# Patient Record
Sex: Female | Born: 1955 | Race: Black or African American | Hispanic: No | Marital: Married | Smoking: Never smoker
Health system: Southern US, Community
[De-identification: ages and names within clinical notes are randomized; demographics above are authoritative.]

## PROBLEM LIST (undated history)

## (undated) DIAGNOSIS — R0600 Dyspnea, unspecified: Secondary | ICD-10-CM

## (undated) DIAGNOSIS — K5900 Constipation, unspecified: Secondary | ICD-10-CM

## (undated) DIAGNOSIS — M1990 Unspecified osteoarthritis, unspecified site: Secondary | ICD-10-CM

## (undated) DIAGNOSIS — F329 Major depressive disorder, single episode, unspecified: Secondary | ICD-10-CM

## (undated) DIAGNOSIS — E119 Type 2 diabetes mellitus without complications: Secondary | ICD-10-CM

## (undated) DIAGNOSIS — I10 Essential (primary) hypertension: Secondary | ICD-10-CM

## (undated) DIAGNOSIS — IMO0001 Reserved for inherently not codable concepts without codable children: Secondary | ICD-10-CM

## (undated) DIAGNOSIS — R011 Cardiac murmur, unspecified: Secondary | ICD-10-CM

## (undated) DIAGNOSIS — K219 Gastro-esophageal reflux disease without esophagitis: Secondary | ICD-10-CM

## (undated) DIAGNOSIS — R310 Gross hematuria: Secondary | ICD-10-CM

## (undated) DIAGNOSIS — L309 Dermatitis, unspecified: Secondary | ICD-10-CM

## (undated) DIAGNOSIS — F32A Depression, unspecified: Secondary | ICD-10-CM

## (undated) HISTORY — DX: Major depressive disorder, single episode, unspecified: F32.9

## (undated) HISTORY — DX: Reserved for inherently not codable concepts without codable children: IMO0001

## (undated) HISTORY — DX: Gastro-esophageal reflux disease without esophagitis: K21.9

## (undated) HISTORY — DX: Dermatitis, unspecified: L30.9

## (undated) HISTORY — PX: NO PAST SURGERIES: SHX2092

## (undated) HISTORY — DX: Cardiac murmur, unspecified: R01.1

## (undated) HISTORY — DX: Gross hematuria: R31.0

## (undated) HISTORY — DX: Constipation, unspecified: K59.00

## (undated) HISTORY — DX: Depression, unspecified: F32.A

## (undated) HISTORY — DX: Essential (primary) hypertension: I10

## (undated) HISTORY — DX: Dyspnea, unspecified: R06.00

## (undated) HISTORY — DX: Unspecified osteoarthritis, unspecified site: M19.90

## (undated) HISTORY — DX: Type 2 diabetes mellitus without complications: E11.9

---

## 2004-06-16 ENCOUNTER — Ambulatory Visit (HOSPITAL_COMMUNITY): Admission: RE | Admit: 2004-06-16 | Discharge: 2004-06-16 | Payer: Self-pay | Admitting: General Surgery

## 2010-11-03 ENCOUNTER — Ambulatory Visit (INDEPENDENT_AMBULATORY_CARE_PROVIDER_SITE_OTHER): Payer: Managed Care, Other (non HMO) | Admitting: Internal Medicine

## 2010-11-03 DIAGNOSIS — D649 Anemia, unspecified: Secondary | ICD-10-CM

## 2010-11-03 DIAGNOSIS — E109 Type 1 diabetes mellitus without complications: Secondary | ICD-10-CM

## 2010-11-25 ENCOUNTER — Other Ambulatory Visit (INDEPENDENT_AMBULATORY_CARE_PROVIDER_SITE_OTHER): Payer: Self-pay | Admitting: Internal Medicine

## 2010-11-25 ENCOUNTER — Ambulatory Visit (HOSPITAL_COMMUNITY)
Admission: RE | Admit: 2010-11-25 | Discharge: 2010-11-25 | Disposition: A | Discharge status: Home / Self Care | Payer: Managed Care, Other (non HMO) | Source: Ambulatory Visit | Attending: Internal Medicine | Admitting: Internal Medicine

## 2010-11-25 ENCOUNTER — Encounter (HOSPITAL_BASED_OUTPATIENT_CLINIC_OR_DEPARTMENT_OTHER): Payer: Managed Care, Other (non HMO) | Admitting: Internal Medicine

## 2010-11-25 DIAGNOSIS — I10 Essential (primary) hypertension: Secondary | ICD-10-CM | POA: Insufficient documentation

## 2010-11-25 DIAGNOSIS — R195 Other fecal abnormalities: Secondary | ICD-10-CM

## 2010-11-25 DIAGNOSIS — D6489 Other specified anemias: Secondary | ICD-10-CM | POA: Insufficient documentation

## 2010-11-25 DIAGNOSIS — D509 Iron deficiency anemia, unspecified: Secondary | ICD-10-CM

## 2010-11-25 DIAGNOSIS — Z01812 Encounter for preprocedural laboratory examination: Secondary | ICD-10-CM | POA: Insufficient documentation

## 2010-11-25 DIAGNOSIS — E119 Type 2 diabetes mellitus without complications: Secondary | ICD-10-CM | POA: Insufficient documentation

## 2010-11-25 DIAGNOSIS — K921 Melena: Secondary | ICD-10-CM | POA: Insufficient documentation

## 2010-11-25 DIAGNOSIS — K449 Diaphragmatic hernia without obstruction or gangrene: Secondary | ICD-10-CM | POA: Insufficient documentation

## 2010-11-25 DIAGNOSIS — Z79899 Other long term (current) drug therapy: Secondary | ICD-10-CM | POA: Insufficient documentation

## 2010-11-25 DIAGNOSIS — D126 Benign neoplasm of colon, unspecified: Secondary | ICD-10-CM

## 2010-11-25 DIAGNOSIS — K2960 Other gastritis without bleeding: Secondary | ICD-10-CM

## 2010-11-25 DIAGNOSIS — K5730 Diverticulosis of large intestine without perforation or abscess without bleeding: Secondary | ICD-10-CM | POA: Insufficient documentation

## 2010-11-25 DIAGNOSIS — K648 Other hemorrhoids: Secondary | ICD-10-CM | POA: Insufficient documentation

## 2010-11-25 LAB — VITAMIN B12: Vitamin B-12: 1508 pg/mL — ABNORMAL HIGH (ref 211–911)

## 2010-11-25 LAB — HEMOGLOBIN AND HEMATOCRIT, BLOOD: HCT: 34.4 % — ABNORMAL LOW (ref 36.0–46.0)

## 2010-11-25 LAB — GLUCOSE, CAPILLARY: Glucose-Capillary: 118 mg/dL — ABNORMAL HIGH (ref 70–99)

## 2010-12-20 NOTE — Op Note (Signed)
NAMEJASIE, Krista [name]               ACCOUNT NO.:  [id number]  MEDICAL RECORD NO.:  [id number]           PATIENT TYPE:  O  LOCATION:  DAYP                          FACILITY:  [facility]  PHYSICIAN:  Lionel December, M.D.    DATE OF BIRTH:  28-Sep-1955  DATE OF PROCEDURE:  11/25/2010 DATE OF DISCHARGE:                              OPERATIVE REPORT   PROCEDURE:  Esophagogastroduodenoscopy followed by colonoscopy.  INDICATION:  Krista [name] is 55 year old Afro American female who was found to have anemia.  Her MCV is low, but iron studies do not confirm iron deficiency.  Serum iron was 51, TIBC 284, and saturation is slightly decreased at 80%, but ferritin is under 18 which is within normal limits.  However, she has heme positive stools.  She has been on chronic NSAID therapy.  The patient's last colonoscopy was in 2003.  Procedures were reviewed with the patient.  Informed consent was obtained.  MEDS FOR CONSCIOUS SEDATION:  Cetacaine spray for oropharyngeal topical anesthesia, Demerol 50 mg IV, Versed 7 mg IV.  FINDINGS:  Procedure performed in endoscopy suite.  The patient's vital signs and O2 sat were monitored during the procedure and remained stable.  PROCEDURES: 1. Esophagogastroduodenoscopy.  The patient was placed in left lateral     recumbent position and Pentax videoscope was passed via oropharynx     without any difficulty into esophagus. 2. Esophagus.  Mucosa of the esophagus was normal.  GE junction was at     36 cm and hiatus was at 38. 3. Stomach.  It was empty and distended very well by insufflation.     Folds in the proximal stomach are atrophic.  Examination of mucosa     at body, antrum, pyloric channel, as well as angularis, fundus, and     cardia was normal. 4. Duodenum.  Bulbar mucosa was normal.  Scope was passed in second     part of duodenal mucosa and folds were normal.  Endoscope was     withdrawn.  The patient prepared for procedure #2. 5. Colonoscopy.  Rectal  examination performed.  No abnormality noted     on external or digital exam.  Pentax videoscope was placed through     rectum and advanced under vision into sigmoid colon and beyond.     Preparation was excellent.  Few tiny diverticula were noted at     sigmoid colon.  Scope was passed into cecum which was identified by     appendiceal orifice and ileocecal valve.  Pictures were taken for     the record.  As the scope was withdrawn, colonic mucosa was     carefully examined.  There was 3-mm polyp at ascending colon which     was ablated via cold biopsy.  Mucosa and rest of the colon was     normal.  Rectal mucosa similarly was normal.  Scope was retroflexed     to examine anorectal junction and small hemorrhoids noted below the     dentate line.  Endoscope was then withdrawn.  Withdrawal time was     11 minutes.  The patient tolerated  the procedures well.  FINAL DIAGNOSES: 1. No evidence of peptic ulcer disease. 2. A 2-cm size sliding hiatal hernia. 3. Atrophic gastric folds. 4. Colonoscopy performed to cecum. 5. A 3-mm polyp ablated via cold biopsy from the ascending colon. 6. Few tiny diverticula at sigmoid colon and external hemorrhoids.  RECOMMENDATIONS: 1. The patient advised to take Mobic once a day if possible. 2. We will check her serum B12 level and H and H today. 3. I will be contacting patient with results of biopsy and blood test     further recommendations.     Lionel December, M.D.     NR/MEDQ  D:  11/25/2010  T:  11/25/2010  Job:  [id number]  cc:   Dr. Sherril Croon  Electronically Signed by Lionel December M.D. on 12/20/2010 10:31:56 AM

## 2010-12-20 NOTE — Consult Note (Signed)
NAMETENESHA, [name]               ACCOUNT NO.:  [id number]  MEDICAL RECORD NO.:  [id number]           PATIENT TYPE:  LOCATION: Office Visit                             FACILITY:  PHYSICIAN:  Krista [name], M.D.    DATE OF BIRTH:  1956-09-07  DATE OF CONSULTATION:  11/03/2010 DATE OF DISCHARGE:                                CONSULTATION   REASON FOR CONSULTATION:  Anemia. history of colonic polyps.  HISTORY OF PRESENT ILLNESS:  [name] is a 55 year old female, referred to our office by Dr. Sherril Croon, for anemia, requesting a colonoscopy and EGD.  On October 05, 2010, it was noted her hemoglobin was 10.6 and hematocrit was 34.0, MCV was 78.  She does state she does have a history of anemia.  She states her appetite has been okay.  She has had no unintentional weight loss.  No nausea or vomiting.  No abdominal pain. She does have frequent acid reflux which is controlled with Protonix. She usually has a bowel movement one a day and sometimes two a day.  Her stools are brown in color, normal caliber.  She denies any fever, fatigue, weight loss, appetite changes, dysphagia, nausea, or vomiting. She denies any rectal bleeding or melena.  Her last menstrual period was at age 23.  Her last colonoscopy that I can find at [facility], was in 2004 for followup of colon polyps.  I do not know what type of colon polyps. The colonoscopy was by Dr. Linna [name], which revealed no colon pathology in terms of ulceration, gross or polyps were seen.  She had scattered diverticula changes.  ALLERGIES:  She is allergic to ASPIRIN and PENICILLIN.  HOME MEDICATIONS: 1. Glipizide 5 mg daily. 2. Metformin 500 mg twice a day. 3. Potassium 10 mEq twice a day. 4. Fexofenadine 180 mg a day. 5. Prozac 20 mg a day. 6. Norvasc 5 mg a day. 7. Mobic 7.5 mg twice a day. 8. Clobetasol topical at night. 9. Protonix 40 mg a day. 10.MVI a day. 11.Vitamin D 1000 units two a day. 12.She had over-the-counter potassium  two a day. 13.Fish oil 1000 mg one a day. 14.Lomotil as needed.  SURGERIES:  She had a repair of a left wrist laceration which was work related.  MEDICAL HISTORY:  Hypertension, diabetes type 2 x2, and she has allergies.  FAMILY HISTORY:  Mother is alive in good health with dementia.  Her father is deceased from an MI.  Two sisters in good health.  One half- brother in good health.  SOCIAL HISTORY:  She is married.  She works at [employer].  She does not smoke, drink, or do drugs.  She does not have children.  OBJECTIVE:  VITAL SIGNS:  Her weight is 221, her height is 5 feet 6 inches, her temperature is 98, her blood pressure is 150/66, and her pulse is 84. HEENT:  She has natural teeth.  Her oral mucosa is moist.  There is no lesions.  Her conjunctiva is pink.  Her sclera is anicteric.  Thyroid is normal.  There is no cervical lymphadenopathy. LUNGS:  Clear. HEART:  Regular rate and rhythm.  ABDOMEN:  Obese, soft.  Bowel sounds are positive.  No masses felt.  Her stool was brown, guaiac-positive today.  ASSESSMENT:  Ms. Screws is a 55 year old female presenting today with anemia.  She does state she has a history of anemia.  Her last colonoscopy was in 2003 with a history of colon polyps.  She has never been worked up for her anemia in the past.  RECOMMENDATIONS:  We will schedule a colonoscopy and an EGD with Dr. Karilyn Cota in the near future to rule out peptic ulcer disease or possible colon cancer.  We will also get iron, ferritin, TIBC, transferrin for her anemia workup.  The risk and benefits were reviewed with the patient and she is agreeable.    ______________________________ Dorene Ar, NP   ______________________________ Krista [name], M.D.    TS/MEDQ  D:  11/03/2010  T:  11/04/2010  Job:  [id number]  cc:   Dr. Sherril Croon  Electronically Signed by Dorene Ar PA on 11/24/2010 04:38:09 PM Electronically Signed by Krista [name] M.D. on 12/20/2010 10:31:36 AM

## 2017-05-26 ENCOUNTER — Telehealth: Payer: Self-pay | Admitting: Cardiovascular Disease

## 2017-05-26 ENCOUNTER — Encounter: Payer: Self-pay | Admitting: Cardiovascular Disease

## 2017-05-26 ENCOUNTER — Ambulatory Visit (INDEPENDENT_AMBULATORY_CARE_PROVIDER_SITE_OTHER): Payer: Managed Care, Other (non HMO) | Admitting: Cardiovascular Disease

## 2017-05-26 VITALS — BP 138/79 | HR 97 | Ht 66.0 in | Wt 234.0 lb

## 2017-05-26 DIAGNOSIS — R011 Cardiac murmur, unspecified: Secondary | ICD-10-CM | POA: Diagnosis not present

## 2017-05-26 DIAGNOSIS — I10 Essential (primary) hypertension: Secondary | ICD-10-CM | POA: Diagnosis not present

## 2017-05-26 DIAGNOSIS — R0602 Shortness of breath: Secondary | ICD-10-CM | POA: Diagnosis not present

## 2017-05-26 DIAGNOSIS — R0609 Other forms of dyspnea: Secondary | ICD-10-CM

## 2017-05-26 NOTE — Telephone Encounter (Signed)
Pre-cert Verification for the following procedure   Echo scheduled for 06-07-17

## 2017-05-26 NOTE — Patient Instructions (Signed)
Medication Instructions:  Continue all current medications.  Labwork: none  Testing/Procedures:  Your physician has requested that you have an echocardiogram. Echocardiography is a painless test that uses sound waves to create images of your heart. It provides your doctor with information about the size and shape of your heart and how well your heart's chambers and valves are working. This procedure takes approximately one hour. There are no restrictions for this procedure.  Office will contact with results via phone or letter.    Follow-Up: 2 months   Any Other Special Instructions Will Be Listed Below (If Applicable).  If you need a refill on your cardiac medications before your next appointment, please call your pharmacy.  

## 2017-05-26 NOTE — Addendum Note (Signed)
Addended by: Laurine Blazer on: 05/26/2017 03:26 PM   Modules accepted: Orders

## 2017-05-26 NOTE — Progress Notes (Signed)
CARDIOLOGY CONSULT NOTE  Patient ID: BRIONNE MERTZ MRN: [id number] DOB/AGE: [birth year] 60 y.o.  Admit date: (Not on file) Primary Physician: Glenda Chroman, MD Referring Physician: Daryll Drown  Reason for Consultation: Cardiac murmur and exertional dyspnea  HPI: Krista [name] is a 61 y.o. female who is being seen today for the evaluation of cardiac murmur and exertional dyspnea at the request of [place] Ponder, [place].   Past medical history includes hypertension and diabetes.  Labs 12/29/16: Total cholesterol 175, HDL 36, trig glycerides 96, LDL 119, BUN 15, creatinine 1.05.  ECG performed in the office today which I ordered and personally interpreted demonstrates normal sinus rhythm with no ischemic ST segment or T-wave abnormalities, nor any arrhythmias.  For the past 2 months she has noticed more exertional dyspnea when climbing 2 flights of stairs at work. She also admits to a 20 pound weight gain in the past 2 months. She says she has lower back pain. She said her hands and ankles swell by the end of the day.  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    Allergies  Allergen Reactions  . Aspirin     ASPIRIN 81 ASPIRIN LOW  ASPIRIN MAX  ASPIRIN ADULT LOW STRENGTH  . Penicillins Nausea Only    Current Outpatient Prescriptions  Medication Sig Dispense Refill  . amLODipine (NORVASC) 5 MG tablet Take 5 mg by mouth daily.    . Biotin 10 MG CAPS Take by mouth.    . diclofenac (VOLTAREN) 75 MG EC tablet Take 75 mg by mouth 2 (two) times daily.    Marland Kitchen FLUoxetine (PROZAC) 40 MG capsule Take 40 mg by mouth daily.    Marland Kitchen gabapentin (NEURONTIN) 300 MG capsule Take 300 mg by mouth 3 (three) times daily.    Marland Kitchen glipiZIDE (GLUCOTROL) 10 MG tablet Take 10 mg by mouth daily before breakfast.    . metFORMIN (GLUCOPHAGE) 500 MG tablet Take by mouth 2 (two) times daily with a meal.    . Multiple Vitamin (MULTIVITAMIN) tablet Take 1 tablet by mouth daily.    . Omega-3 Fatty Acids (FISH  OIL) 1000 MG CAPS Take by mouth.    . pantoprazole (PROTONIX) 40 MG tablet Take 40 mg by mouth daily.    . ranitidine (ZANTAC) 300 MG capsule Take 300 mg by mouth every evening.     No current facility-administered medications for this visit.     Past Medical History:  Diagnosis Date  . Arthritis   . 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 Constipation   . Depression   . Dyspnea   . Eczema   . GERD (gastroesophageal reflux disease)   . Gross hematuria   . Heart murmur   . Hypertension   . Type 2 diabetes mellitus ([place])     Past Surgical History:  Procedure Laterality Date  . NO PAST SURGERIES      Social History   Social History  . Marital status: Single    Spouse name: N/A  . Number of children: N/A  . Years of education: N/A   Occupational History  . Not on file.   Social History Main Topics  . Smoking status: Never Smoker  . Smokeless tobacco: Never Used  . Alcohol use Not on file  . Drug use: Unknown  . Sexual activity: Not on file   Other Topics Concern  . Not on file   Social History Narrative  .  No narrative on file     No family history of premature CAD in 1st degree relatives.  Current Meds  Medication Sig  . amLODipine (NORVASC) 5 MG tablet Take 5 mg by mouth daily.  . Biotin 10 MG CAPS Take by mouth.  . diclofenac (VOLTAREN) 75 MG EC tablet Take 75 mg by mouth 2 (two) times daily.  Marland Kitchen FLUoxetine (PROZAC) 40 MG capsule Take 40 mg by mouth daily.  Marland Kitchen gabapentin (NEURONTIN) 300 MG capsule Take 300 mg by mouth 3 (three) times daily.  Marland Kitchen glipiZIDE (GLUCOTROL) 10 MG tablet Take 10 mg by mouth daily before breakfast.  . metFORMIN (GLUCOPHAGE) 500 MG tablet Take by mouth 2 (two) times daily with a meal.  . Multiple Vitamin (MULTIVITAMIN) tablet Take 1 tablet by mouth daily.  . Omega-3 Fatty Acids (FISH OIL) 1000 MG CAPS Take by mouth.  . pantoprazole (PROTONIX) 40 MG tablet Take 40 mg by mouth daily.  . ranitidine (ZANTAC) 300 MG capsule Take  300 mg by mouth every evening.      Review of systems complete and found to be negative unless listed above in HPI    Physical exam Blood pressure 138/79, pulse 97, height 5\' 6"  (1.676 m), weight 234 lb (106.1 kg), SpO2 99 %. General: NAD Neck: No JVD, no thyromegaly or thyroid nodule.  Lungs: Clear to auscultation bilaterally with normal respiratory effort. CV: Nondisplaced PMI. Regular rate and rhythm, normal S1/S2, no Z6/X0, 2/6 systolic murmur over RUSB.  No peripheral edema.  No carotid bruit.    Abdomen: Soft, nontender, protuberant.  Skin: Intact without lesions or rashes.  Neurologic: Alert and oriented x 3.  Psych: Normal affect. Extremities: No clubbing or cyanosis.  HEENT: Normal.   ECG: Most recent ECG reviewed.   Labs: Lab Results  Component Value Date/Time   HGB 10.7 (L) 11/25/2010 12:10 PM     Lipids: No results found for: LDLCALC, LDLDIRECT, CHOL, TRIG, HDL      ASSESSMENT AND PLAN:  1. Cardiac murmur with exertional dyspnea: While this may be a benign outflow tract murmur, I will have to rule out the possibility of aortic valve sclerosis and/or stenosis. I will order a 2-D echocardiogram with Doppler to evaluate cardiac structure, function, and regional wall motion.  2. HTN: Mildly elevated. Will monitor. Needs lifestyle modification with exercise and weight loss.  3. DOE: I will evaluate cardiac structure and function with an echocardiogram. She has also had a 20 pound weight gain in the last 2 months which may be contributing to symptoms as well.   Disposition: Follow up in 1 month  Signed: Kate Sable, M.D., F.A.C.C.  05/26/2017, 3:13 PM

## 2017-06-07 ENCOUNTER — Ambulatory Visit (INDEPENDENT_AMBULATORY_CARE_PROVIDER_SITE_OTHER): Payer: Managed Care, Other (non HMO)

## 2017-06-07 ENCOUNTER — Other Ambulatory Visit: Payer: Self-pay

## 2017-06-07 DIAGNOSIS — R0609 Other forms of dyspnea: Secondary | ICD-10-CM

## 2017-06-07 DIAGNOSIS — R011 Cardiac murmur, unspecified: Secondary | ICD-10-CM | POA: Diagnosis not present

## 2017-06-23 ENCOUNTER — Telehealth: Payer: Self-pay | Admitting: *Deleted

## 2017-06-23 NOTE — Telephone Encounter (Signed)
Notes recorded by Laurine Blazer, LPN on 16/09/958 at 4:54 PM EDT Patient notified via vm. Copy to pmd. Follow up already scheduled for 07/28/2017 with Dr. Bronson Ing. ------  Notes recorded by Laurine Blazer, LPN on 0/98/1191 at 4:78 PM EDT Left message to return call. ------  Notes recorded by Herminio Commons, MD on 06/07/2017 at 5:17 PM EDT Normal cardiac function.

## 2017-07-28 ENCOUNTER — Ambulatory Visit (INDEPENDENT_AMBULATORY_CARE_PROVIDER_SITE_OTHER): Payer: Managed Care, Other (non HMO) | Admitting: Cardiovascular Disease

## 2017-07-28 ENCOUNTER — Encounter: Payer: Self-pay | Admitting: *Deleted

## 2017-07-28 ENCOUNTER — Telehealth: Payer: Self-pay | Admitting: Cardiovascular Disease

## 2017-07-28 ENCOUNTER — Encounter: Payer: Self-pay | Admitting: Cardiovascular Disease

## 2017-07-28 VITALS — BP 132/62 | HR 90 | Ht 66.0 in | Wt 231.0 lb

## 2017-07-28 DIAGNOSIS — R0609 Other forms of dyspnea: Secondary | ICD-10-CM

## 2017-07-28 DIAGNOSIS — I10 Essential (primary) hypertension: Secondary | ICD-10-CM | POA: Diagnosis not present

## 2017-07-28 DIAGNOSIS — R011 Cardiac murmur, unspecified: Secondary | ICD-10-CM

## 2017-07-28 NOTE — Telephone Encounter (Signed)
Pre-cert Verification for the following procedure   lexiscan scheduled for 08-09-17 at [facility]

## 2017-07-28 NOTE — Patient Instructions (Signed)
Medication Instructions:  Continue all current medications.  Labwork: none  Testing/Procedures:  Your physician has requested that you have a lexiscan myoview. For further information please visit [email or web address]. Please follow instruction sheet, as given.  Office will contact with results via phone or letter.    Follow-Up: 2 months   Any Other Special Instructions Will Be Listed Below (If Applicable).  If you need a refill on your cardiac medications before your next appointment, please call your pharmacy.

## 2017-07-28 NOTE — Addendum Note (Signed)
Addended by: Laurine Blazer on: 07/28/2017 04:17 PM   Modules accepted: Orders

## 2017-07-28 NOTE — Progress Notes (Signed)
SUBJECTIVE: The patient returns for follow-up after undergoing cardiovascular testing performed for the evaluation of exertional dyspnea and a cardiac murmur.  Echocardiogram on 06/07/17 demonstrated vigorous left ventricular systolic function, LVEF 93-81%, normal diastolic function, and no significant valvular stenosis or regurgitation.  The aortic annulus was moderately calcified.  She denies exertional chest pain.  She has exertional dyspnea when climbing 2 flights of stairs and the symptoms began in late July/early August.  She only gets chest discomfort if she eats greasy or fried foods.  She is not able to do a lot of walking due to left knee arthritis.     Review of Systems: As per "subjective", otherwise negative.  Allergies  Allergen Reactions  . Aspirin     ASPIRIN 81 ASPIRIN LOW  ASPIRIN MAX  ASPIRIN ADULT LOW STRENGTH  . Penicillins Nausea Only    Current Outpatient Medications  Medication Sig Dispense Refill  . amLODipine (NORVASC) 5 MG tablet Take 5 mg by mouth daily.    . Biotin 10 MG CAPS Take by mouth.    . diclofenac (VOLTAREN) 75 MG EC tablet Take 75 mg by mouth 2 (two) times daily.    Marland Kitchen FLUoxetine (PROZAC) 40 MG capsule Take 40 mg by mouth daily.    Marland Kitchen gabapentin (NEURONTIN) 300 MG capsule Take 300 mg by mouth 3 (three) times daily.    Marland Kitchen glipiZIDE (GLUCOTROL) 10 MG tablet Take 10 mg by mouth daily before breakfast.    . Lactobacillus (PROBIOTIC ACIDOPHILUS PO) Take by mouth. Daily    . metFORMIN (GLUCOPHAGE) 500 MG tablet Take by mouth 2 (two) times daily with a meal.    . Multiple Vitamin (MULTIVITAMIN) tablet Take 1 tablet by mouth daily.    . Omega-3 Fatty Acids (FISH OIL) 1000 MG CAPS Take by mouth.    . pantoprazole (PROTONIX) 40 MG tablet Take 40 mg by mouth daily.    . ranitidine (ZANTAC) 300 MG capsule Take 300 mg by mouth every evening.     No current facility-administered medications for this visit.     Past Medical History:  Diagnosis  Date  . Arthritis   . 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 Constipation   . Depression   . Dyspnea   . Eczema   . GERD (gastroesophageal reflux disease)   . Gross hematuria   . Heart murmur   . Hypertension   . Type 2 diabetes mellitus ([place])     Past Surgical History:  Procedure Laterality Date  . NO PAST SURGERIES      Social History   Socioeconomic History  . Marital status: Single    Spouse name: Not on file  . Number of children: Not on file  . Years of education: Not on file  . Highest education level: Not on file  Social Needs  . Financial resource strain: Not on file  . Food insecurity - worry: Not on file  . Food insecurity - inability: Not on file  . Transportation needs - medical: Not on file  . Transportation needs - non-medical: Not on file  Occupational History  . Not on file  Tobacco Use  . Smoking status: Never Smoker  . Smokeless tobacco: Never Used  Substance and Sexual Activity  . Alcohol use: Not on file  . Drug use: Not on file  . Sexual activity: Not on file  Other Topics Concern  . Not on file  Social History Narrative  . Not  on file     Vitals:   07/28/17 1544  BP: 132/62  Pulse: 90  SpO2: 96%  Weight: 231 lb (104.8 kg)  Height: 5\' 6"  (1.676 m)    Wt Readings from Last 3 Encounters:  07/28/17 231 lb (104.8 kg)  05/26/17 234 lb (106.1 kg)     PHYSICAL EXAM General: NAD HEENT: Normal. Neck: No JVD, no thyromegaly. Lungs: Clear to auscultation bilaterally with normal respiratory effort. CV: Regular rate and rhythm, normal S1/S2, no O2/U2, 2/6 systolic murmur over RUSB. No pretibial or periankle edema.   Abdomen: Soft, nontender, no distention.  Neurologic: Alert and oriented.  Psych: Normal affect. Skin: Normal. Musculoskeletal: No gross deformities.    ECG: Most recent ECG reviewed.   Labs: Lab Results  Component Value Date/Time   HGB 10.7 (L) 11/25/2010 12:10 PM     Lipids: No results found for:  LDLCALC, LDLDIRECT, CHOL, TRIG, HDL     ASSESSMENT AND PLAN:  1. 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  No further testing is indicated.  2.  Hypertension: Controlled.  No changes to therapy.  3.  Exertional dyspnea:  Symptoms began in late July/early August.  Risk factors for coronary artery disease include type 2 diabetes and hypertension.  She has also had a 20 pound weight gain in the last 2 months which may be contributing to symptoms as well.  She works in a Production designer, theatre/television/film and I wonder if she has inhaled particulate matter over time leading to pulmonary issues. She is unable to do any significant walking due to significant left knee arthritis. I will proceed with a nuclear myocardial perfusion imaging study to evaluate for ischemic heart disease (Lexiscan Myoview).    Disposition: Follow up 2-3 months   Kate Sable, M.D., F.A.C.C.

## 2017-08-08 ENCOUNTER — Encounter (HOSPITAL_COMMUNITY): Payer: Managed Care, Other (non HMO)

## 2017-08-09 ENCOUNTER — Encounter (HOSPITAL_BASED_OUTPATIENT_CLINIC_OR_DEPARTMENT_OTHER)
Admission: RE | Admit: 2017-08-09 | Discharge: 2017-08-09 | Disposition: A | Discharge status: Home / Self Care | Payer: Managed Care, Other (non HMO) | Source: Ambulatory Visit | Attending: Cardiovascular Disease | Admitting: Cardiovascular Disease

## 2017-08-09 ENCOUNTER — Ambulatory Visit (HOSPITAL_COMMUNITY)
Admission: RE | Admit: 2017-08-09 | Discharge: 2017-08-09 | Disposition: A | Discharge status: Home / Self Care | Payer: Managed Care, Other (non HMO) | Source: Ambulatory Visit | Attending: Cardiovascular Disease | Admitting: Cardiovascular Disease

## 2017-08-09 ENCOUNTER — Encounter (HOSPITAL_COMMUNITY): Payer: Self-pay

## 2017-08-09 DIAGNOSIS — R0609 Other forms of dyspnea: Secondary | ICD-10-CM | POA: Insufficient documentation

## 2017-08-09 LAB — NM MYOCAR MULTI W/SPECT W/WALL MOTION / EF
CHL CUP NUCLEAR SRS: 2
CHL CUP NUCLEAR SSS: 2
LHR: 0.6
LV sys vol: 21 mL
LVDIAVOL: 60 mL (ref 46–106)
Peak HR: 100 {beats}/min
Rest HR: 81 {beats}/min
SDS: 0
TID: 1.12

## 2017-08-09 MED ORDER — TECHNETIUM TC 99M TETROFOSMIN IV KIT
10.0000 | PACK | Freq: Once | INTRAVENOUS | Status: AC | PRN
  Administered 2017-08-09: 10.6 via INTRAVENOUS

## 2017-08-09 MED ORDER — SODIUM CHLORIDE 0.9% FLUSH
INTRAVENOUS | Status: AC
  Administered 2017-08-09: 10 mL via INTRAVENOUS
  Filled 2017-08-09: qty 10

## 2017-08-09 MED ORDER — SODIUM CHLORIDE 0.9% FLUSH
INTRAVENOUS | Status: AC
  Filled 2017-08-09: qty 180

## 2017-08-09 MED ORDER — TECHNETIUM TC 99M TETROFOSMIN IV KIT
30.0000 | PACK | Freq: Once | INTRAVENOUS | Status: AC | PRN
  Administered 2017-08-09: 31 via INTRAVENOUS

## 2017-08-09 MED ORDER — REGADENOSON 0.4 MG/5ML IV SOLN
INTRAVENOUS | Status: AC
  Administered 2017-08-09: 0.4 mg via INTRAVENOUS
  Filled 2017-08-09: qty 5

## 2017-08-22 ENCOUNTER — Encounter: Payer: Self-pay | Admitting: *Deleted

## 2017-09-29 ENCOUNTER — Ambulatory Visit: Payer: Managed Care, Other (non HMO) | Admitting: Cardiovascular Disease

## 2017-12-07 ENCOUNTER — Encounter (INDEPENDENT_AMBULATORY_CARE_PROVIDER_SITE_OTHER): Payer: Self-pay | Admitting: *Deleted

## 2019-04-08 NOTE — H&P (Signed)
TOTAL KNEE ADMISSION H&P  Patient is being admitted for left total knee arthroplasty.  Subjective:  Chief Complaint:left knee pain.  HPI: Krista [name], 63 y.o. female, has a history of pain and functional disability in the left knee due to arthritis and has failed non-surgical conservative treatments for greater than 12 weeks to includecorticosteriod injections, use of assistive devices and activity modification.  Onset of symptoms was gradual, starting 1 years ago with gradually worsening course since that time. The patient noted no past surgery on the left knee(s).  Patient currently rates pain in the left knee(s) at 10 out of 10 with activity. Patient has worsening of pain with activity and weight bearing, pain that interferes with activities of daily living and joint swelling.  Patient has evidence of bone-on-bone arthritis in the medial and patellofemoral compartments of the left knee with varus deformity and slight tibial subluxation by imaging studies. There is no active infection.  There are no active problems to display for this patient.  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    Current Outpatient Medications  Medication Sig Dispense Refill Last Dose   amLODipine (NORVASC) 5 MG tablet Take 5 mg by mouth daily.   Taking   Biotin 10 MG CAPS Take by mouth.   Taking   diclofenac (VOLTAREN) 75 MG EC tablet Take 75 mg by mouth 2 (two) times daily.   Taking   FLUoxetine (PROZAC) 40 MG capsule Take 40 mg by mouth daily.   Taking   gabapentin (NEURONTIN) 300 MG capsule Take 300 mg by  mouth 3 (three) times daily.   Taking   glipiZIDE (GLUCOTROL) 10 MG tablet Take 10 mg by mouth daily before breakfast.   Taking   Lactobacillus (PROBIOTIC ACIDOPHILUS PO) Take by mouth. Daily   Taking   metFORMIN (GLUCOPHAGE) 500 MG tablet Take by mouth 2 (two) times daily with a meal.   Taking   Multiple Vitamin (MULTIVITAMIN) tablet Take 1 tablet by mouth daily.   Taking   Omega-3 Fatty Acids (FISH OIL) 1000 MG CAPS Take by mouth.   Taking   pantoprazole (PROTONIX) 40 MG tablet Take 40 mg by mouth daily.   Taking   ranitidine (ZANTAC) 300 MG capsule Take 300 mg by mouth every evening.   Taking   Allergies  Allergen Reactions   Aspirin     ASPIRIN 81 ASPIRIN LOW  ASPIRIN MAX  ASPIRIN ADULT LOW STRENGTH   Penicillins Nausea Only    Social History   Tobacco Use   Smoking status: Never Smoker   Smokeless tobacco: Never Used  Substance Use Topics   Alcohol use: Not on file    Family History  Problem Relation Age of Onset   Dementia Mother    Diabetes Mother    Diabetes Father      Review of Systems  Constitutional: Negative for chills and fever.  HENT: Negative for congestion, sore throat and tinnitus.   Eyes: Negative for double vision, photophobia and pain.  Respiratory:  Negative for cough, shortness of breath and wheezing.   Cardiovascular: Negative for chest pain, palpitations and orthopnea.  Gastrointestinal: Negative for heartburn, nausea and vomiting.  Genitourinary: Negative for dysuria, frequency and urgency.  Musculoskeletal: Positive for joint pain.  Neurological: Negative for dizziness, weakness and headaches.    Objective:  Physical Exam  Well nourished and well developed.  General: Alert and oriented x3, cooperative and pleasant, no acute distress.  Head: normocephalic, atraumatic, neck supple.  Eyes: EOMI.  Respiratory: breath sounds clear in all fields, no wheezing, rales, or rhonchi. Cardiovascular: Regular rate and rhythm, no murmurs,  gallops or rubs.  Abdomen: non-tender to palpation and soft, normoactive bowel sounds. Musculoskeletal:  Left Knee Exam:  No effusion.  Varus deformity. Range of motion is 5-125 degrees.  Marked crepitus on range of motion of the knee.  Positive medial, greater than lateral, joint line tenderness. Stable knee.  Calves soft and nontender. Motor function intact in LE. Strength 5/5 LE bilaterally. Neuro: Distal pulses 2+. Sensation to light touch intact in LE.   Vital signs in last 24 hours: Blood pressure: 152/86 mmHg Pulse: 88 bpm  Labs:   Estimated body mass index is 37.28 kg/m as calculated from the following:   Height as of 07/28/17: 5\' 6"  (1.676 m).   Weight as of 07/28/17: 104.8 kg.   Imaging Review Plain radiographs demonstrate severe degenerative joint disease of the left knee(s). The overall alignment issignificant varus. The bone quality appears to be adequate for age and reported activity level.  Assessment/Plan:  End stage arthritis, left knee   The patient history, physical examination, clinical judgment of the provider and imaging studies are consistent with end stage degenerative joint disease of the left knee(s) and total knee arthroplasty is deemed medically necessary. The treatment options including medical management, injection therapy arthroscopy and arthroplasty were discussed at length. The risks and benefits of total knee arthroplasty were presented and reviewed. The risks due to aseptic loosening, infection, stiffness, patella tracking problems, thromboembolic complications and other imponderables were discussed. The patient acknowledged the explanation, agreed to proceed with the plan and consent was signed. Patient is being admitted for inpatient treatment for surgery, pain control, PT, OT, prophylactic antibiotics, VTE prophylaxis, progressive ambulation and ADL's and discharge planning. The patient is planning to be discharged home.  Anticipated LOS equal  to or greater than 2 midnights due to - Age 68 and older with one or more of the following:  - Obesity  - Expected need for hospital services (PT, OT, Nursing) required for safe  discharge  - Anticipated need for postoperative skilled nursing care or inpatient rehab  - Active co-morbidities: Diabetes OR   - Unanticipated findings during/Post Surgery: None  - Patient is a high risk of re-admission due to: None  Therapy Plans: Outpatient therapy at [facility] in [place] Disposition: Home with spouse Planned DVT Prophylaxis: Xarelto 10 mg daily (allergy to ASA) DME needed: [facility] PCP: Dr. Woody Seller TXA: IV Allergies: ASA (nausea), PCN (nausea) Anesthesia Concerns: None BMI: 34.5 Last HgbA1c: 6.5% as of 01/24/2019  - Patient was instructed on what medications to stop prior to surgery. - Follow-up visit in 2 weeks with Dr. Wynelle Link - Begin physical therapy following surgery - Pre-operative lab work as pre-surgical testing - Prescriptions will be provided in hospital at time of discharge  Theresa Duty, PA-C Orthopedic Surgery [facility] [place]

## 2019-04-23 ENCOUNTER — Other Ambulatory Visit (HOSPITAL_COMMUNITY): Payer: Self-pay | Admitting: *Deleted

## 2019-04-23 NOTE — Progress Notes (Signed)
LOV DR Bronson Ing 05-26-17 Epic STRESS TEST 11-21-8 EPIC

## 2019-04-23 NOTE — Patient Instructions (Addendum)
YOU NEED TO HAVE A COVID 19 TEST ON 04-25-2019 @  315 pm at [facility]. THIS TEST MUST BE DONE BEFORE SURGERY, [address]  [place], [place] , [address]. ONCE YOUR COVID TEST IS COMPLETED, PLEASE BEGIN THE QUARANTINE INSTRUCTIONS AS OUTLINED IN YOUR HANDOUT.                Krista [name]    Your procedure is scheduled on: 04-29-2019  Report to [facility] Main  Entrance  Report to admitting at 800 AM   1 VISITOR IS ALLOWED TO WAIT IN WAITING ROOM  ONLY DAY OF YOUR SURGERY.    Call this number if you have problems the morning of surgery [phone]    Remember:  [place], NO CHEWING GUM CANDY OR MINTS.   NO SOLID FOOD AFTER MIDNIGHT THE NIGHT PRIOR TO SURGERY. NOTHING BY MOUTH EXCEPT CLEAR LIQUIDS UNTIL 730 am. PLEASE FINISH G2 DRINK  PER SURGEON ORDER 3 HOURS PRIOR TO SCHEDULED SURGERY TIME WHICH NEEDS TO BE COMPLETED AT 730 AM.  CLEAR LIQUID DIET   Foods Allowed                                                                     Foods Excluded  Coffee and tea, regular and decaf                             liquids that you cannot  Plain Jell-O any favor except red or purple                                           see through such as: Fruit ices (not with fruit pulp)                                     milk, soups, orange juice  Iced Popsicles                                    All solid food Carbonated beverages, regular and diet                                    Cranberry, grape and apple juices Sports drinks like Gatorade Lightly seasoned clear broth or consume(fat free) Sugar, honey syrup  Sample Menu Breakfast                                Lunch                                     Supper Cranberry juice  Beef broth                            Chicken broth Jell-O                                     Grape juice                           Apple juice Coffee or tea                         Jell-O                                      Popsicle                                                Coffee or tea                        Coffee or tea  _____________________________________________________________________     Take these medicines the morning of surgery with A SIP OF WATER:                   GABAPENTIN, PANTAPRAZOLE (PROTONIX)         How to Manage Your Diabetes Before and After Surgery  Why is it important to control my blood sugar before and after surgery? . Improving blood sugar levels before and after surgery helps healing and can limit problems. . A way of improving blood sugar control is eating a healthy diet by: o  Eating less sugar and carbohydrates o  Increasing activity/exercise o  Talking with your doctor about reaching your blood sugar goals . High blood sugars (greater than 180 mg/dL) can raise your risk of infections and slow your recovery, so you will need to focus on controlling your diabetes during the weeks before surgery. . Make sure that the doctor who takes care of your diabetes knows about your planned surgery including the date and location.  How do I manage my blood sugar before surgery? . Check your blood sugar at least 4 times a day, starting 2 days before surgery, to make sure that the level is not too high or low. o Check your blood sugar the morning of your surgery when you wake up and every 2 hours until you get to the Short Stay unit. . If your blood sugar is less than 70 mg/dL, you will need to treat for low blood sugar: o Do not take insulin. o Treat a low blood sugar (less than 70 mg/dL) with  cup of clear juice (cranberry or apple), 4 glucose tablets, OR glucose gel. o Recheck blood sugar in 15 minutes after treatment (to make sure it is greater than 70 mg/dL). If your blood sugar is not greater than 70 mg/dL on recheck, call [phone] for further instructions. . Report your blood sugar to the short stay nurse when you get to  Short Stay.  . If you are admitted to the hospital after surgery: o Your blood sugar will be checked  by the staff and you will probably be given insulin after surgery (instead of oral diabetes medicines) to make sure you have good blood sugar levels. o The goal for blood sugar control after surgery is 80-180 mg/dL.   WHAT DO I DO ABOUT MY DIABETES MEDICATION?  Marland Kitchen Do not take oral diabetes medicines (pills) the morning of surgery.  . THE DAY  BEFORE SURGERY TAKE METFORMIN AND GLIPIZIDE and RYBELSUS AS USUAL.       . THE MORNING OF SURGERY DO NOT TAKE METFORMIN OR GLIPIZIDE. OR RYBELSUS  .        You may not have any metal on your body including hair pins and              piercings  Do not wear jewelry, make-up, lotions, powders or perfumes, deodorant             Do not wear nail polish.  Do not shave  48 hours prior to surgery.                Do not bring valuables to the hospital. Center.  Contacts, dentures or bridgework may not be worn into surgery.  Leave suitcase in the car. After surgery it may be brought to your room.      _____________________________________________________________________             [facility] - Preparing for Surgery Before surgery, you can play an important role.  Because skin is not sterile, your skin needs to be as free of germs as possible.  You can reduce the number of germs on your skin by washing with CHG (chlorahexidine gluconate) soap before surgery.  CHG is an antiseptic cleaner which kills germs and bonds with the skin to continue killing germs even after washing. Please DO NOT use if you have an allergy to CHG or antibacterial soaps.  If your skin becomes reddened/irritated stop using the CHG and inform your nurse when you arrive at Short Stay. Do not shave (including legs and underarms) for at least 48 hours prior to the first CHG shower.  You may shave your face/neck. Please follow  these instructions carefully:  1.  Shower with CHG Soap the night before surgery and the  morning of Surgery.  2.  If you choose to wash your hair, wash your hair first as usual with your  normal  shampoo.  3.  After you shampoo, rinse your hair and body thoroughly to remove the  shampoo.                           4.  Use CHG as you would any other liquid soap.  You can apply chg directly  to the skin and wash                       Gently with a scrungie or clean washcloth.  5.  Apply the CHG Soap to your body ONLY FROM THE NECK DOWN.   Do not use on face/ open                           Wound or open sores. Avoid contact with eyes, ears mouth and genitals (private parts).  Wash face,  Genitals (private parts) with your normal soap.             6.  Wash thoroughly, paying special attention to the area where your surgery  will be performed.  7.  Thoroughly rinse your body with warm water from the neck down.  8.  DO NOT shower/wash with your normal soap after using and rinsing off  the CHG Soap.                9.  Pat yourself dry with a clean towel.            10.  Wear clean pajamas.            11.  Place clean sheets on your bed the night of your first shower and do not  sleep with pets. Day of Surgery : Do not apply any lotions/deodorants the morning of surgery.  Please wear clean clothes to the hospital/surgery center.  FAILURE TO FOLLOW THESE INSTRUCTIONS MAY RESULT IN THE CANCELLATION OF YOUR SURGERY PATIENT SIGNATURE_________________________________  NURSE SIGNATURE__________________________________  ________________________________________________________________________   Adam Phenix  An incentive spirometer is a tool that can help keep your lungs clear and active. This tool measures how well you are filling your lungs with each breath. Taking long deep breaths may help reverse or decrease the chance of developing breathing (pulmonary) problems  (especially infection) following:  A long period of time when you are unable to move or be active. BEFORE THE PROCEDURE   If the spirometer includes an indicator to show your best effort, your nurse or respiratory therapist will set it to a desired goal.  If possible, sit up straight or lean slightly forward. Try not to slouch.  Hold the incentive spirometer in an upright position. INSTRUCTIONS FOR USE  1. Sit on the edge of your bed if possible, or sit up as far as you can in bed or on a chair. 2. Hold the incentive spirometer in an upright position. 3. Breathe out normally. 4. Place the mouthpiece in your mouth and seal your lips tightly around it. 5. Breathe in slowly and as deeply as possible, raising the piston or the ball toward the top of the column. 6. Hold your breath for 3-5 seconds or for as long as possible. Allow the piston or ball to fall to the bottom of the column. 7. Remove the mouthpiece from your mouth and breathe out normally. 8. Rest for a few seconds and repeat Steps 1 through 7 at least 10 times every 1-2 hours when you are awake. Take your time and take a few normal breaths between deep breaths. 9. The spirometer may include an indicator to show your best effort. Use the indicator as a goal to work toward during each repetition. 10. After each set of 10 deep breaths, practice coughing to be sure your lungs are clear. If you have an incision (the cut made at the time of surgery), support your incision when coughing by placing a pillow or rolled up towels firmly against it. Once you are able to get out of bed, walk around indoors and cough well. You may stop using the incentive spirometer when instructed by your caregiver.  RISKS AND COMPLICATIONS  Take your time so you do not get dizzy or light-headed.  If you are in pain, you may need to take or ask for pain medication before doing incentive spirometry. It is harder to take a deep breath if you are having  pain. AFTER USE  Rest and breathe slowly and easily.  It can be helpful to keep track of a log of your progress. Your caregiver can provide you with a simple table to help with this. If you are using the spirometer at home, follow these instructions: [place] IF:   You are having difficultly using the spirometer.  You have trouble using the spirometer as often as instructed.  Your pain medication is not giving enough relief while using the spirometer.  You develop fever of 100.5 F (38.1 C) or higher. SEEK IMMEDIATE MEDICAL CARE IF:   You cough up bloody sputum that had not been present before.  You develop fever of 102 F (38.9 C) or greater.  You develop worsening pain at or near the incision site. MAKE SURE YOU:   Understand these instructions.  Will watch your condition.  Will get help right away if you are not doing well or get worse. Document Released: 01/16/2007 Document Revised: 11/28/2011 Document Reviewed: 03/19/2007 Exit[employer] Patient Information 2014 Exit[employer], [place].   ________________________________________________________________________  WHAT IS A BLOOD TRANSFUSION? Blood Transfusion Information  A transfusion is the replacement of blood or some of its parts. Blood is made up of multiple cells which provide different functions.  Red blood cells carry oxygen and are used for blood loss replacement.  White blood cells fight against infection.  Platelets control bleeding.  Plasma helps clot blood.  Other blood products are available for specialized needs, such as hemophilia or other clotting disorders. BEFORE THE TRANSFUSION  Who gives blood for transfusions?   Healthy volunteers who are fully evaluated to make sure their blood is safe. This is blood bank blood. Transfusion therapy is the safest it has ever been in the practice of medicine. Before blood is taken from a donor, a complete history is taken to make sure that person has no history  of diseases nor engages in risky social behavior (examples are intravenous drug use or sexual activity with multiple partners). The donor's travel history is screened to minimize risk of transmitting infections, such as malaria. The donated blood is tested for signs of infectious diseases, such as HIV and hepatitis. The blood is then tested to be sure it is compatible with you in order to minimize the chance of a transfusion reaction. If you or a relative donates blood, this is often done in anticipation of surgery and is not appropriate for emergency situations. It takes many days to process the donated blood. RISKS AND COMPLICATIONS Although transfusion therapy is very safe and saves many lives, the main dangers of transfusion include:   Getting an infectious disease.  Developing a transfusion reaction. This is an allergic reaction to something in the blood you were given. Every precaution is taken to prevent this. The decision to have a blood transfusion has been considered carefully by your caregiver before blood is given. Blood is not given unless the benefits outweigh the risks. AFTER THE TRANSFUSION  Right after receiving a blood transfusion, you will usually feel much better and more energetic. This is especially true if your red blood cells have gotten low (anemic). The transfusion raises the level of the red blood cells which carry oxygen, and this usually causes an energy increase.  The nurse administering the transfusion will monitor you carefully for complications. HOME CARE INSTRUCTIONS  No special instructions are needed after a transfusion. You may find your energy is better. Speak with your caregiver about any limitations on activity for underlying diseases  you may have. SEEK MEDICAL CARE IF:   Your condition is not improving after your transfusion.  You develop redness or irritation at the intravenous (IV) site. SEEK IMMEDIATE MEDICAL CARE IF:  Any of the following symptoms  occur over the next 12 hours:  Shaking chills.  You have a temperature by mouth above 102 F (38.9 C), not controlled by medicine.  Chest, back, or muscle pain.  People around you feel you are not acting correctly or are confused.  Shortness of breath or difficulty breathing.  Dizziness and fainting.  You get a rash or develop hives.  You have a decrease in urine output.  Your urine turns a dark color or changes to pink, red, or brown. Any of the following symptoms occur over the next 10 days:  You have a temperature by mouth above 102 F (38.9 C), not controlled by medicine.  Shortness of breath.  Weakness after normal activity.  The white part of the eye turns yellow (jaundice).  You have a decrease in the amount of urine or are urinating less often.  Your urine turns a dark color or changes to pink, red, or brown. Document Released: 09/02/2000 Document Revised: 11/28/2011 Document Reviewed: 04/21/2008 [facility] Patient Information 2014 [place], [place].  _______________________________________________________________________

## 2019-04-24 ENCOUNTER — Encounter (HOSPITAL_COMMUNITY)
Admission: RE | Admit: 2019-04-24 | Discharge: 2019-04-24 | Disposition: A | Discharge status: Home / Self Care | Payer: Managed Care, Other (non HMO) | Source: Ambulatory Visit | Attending: Orthopedic Surgery | Admitting: Orthopedic Surgery

## 2019-04-24 ENCOUNTER — Encounter (HOSPITAL_COMMUNITY): Payer: Self-pay

## 2019-04-24 ENCOUNTER — Other Ambulatory Visit: Payer: Self-pay

## 2019-04-24 DIAGNOSIS — Z79899 Other long term (current) drug therapy: Secondary | ICD-10-CM | POA: Diagnosis not present

## 2019-04-24 DIAGNOSIS — K219 Gastro-esophageal reflux disease without esophagitis: Secondary | ICD-10-CM | POA: Insufficient documentation

## 2019-04-24 DIAGNOSIS — Z20828 Contact with and (suspected) exposure to other viral communicable diseases: Secondary | ICD-10-CM | POA: Insufficient documentation

## 2019-04-24 DIAGNOSIS — E119 Type 2 diabetes mellitus without complications: Secondary | ICD-10-CM | POA: Insufficient documentation

## 2019-04-24 DIAGNOSIS — M1712 Unilateral primary osteoarthritis, left knee: Secondary | ICD-10-CM | POA: Insufficient documentation

## 2019-04-24 DIAGNOSIS — I119 Hypertensive heart disease without heart failure: Secondary | ICD-10-CM | POA: Diagnosis not present

## 2019-04-24 DIAGNOSIS — Z01818 Encounter for other preprocedural examination: Secondary | ICD-10-CM | POA: Insufficient documentation

## 2019-04-24 DIAGNOSIS — Z7984 Long term (current) use of oral hypoglycemic drugs: Secondary | ICD-10-CM | POA: Insufficient documentation

## 2019-04-24 LAB — CBC
HCT: 38.3 % (ref 36.0–46.0)
Hemoglobin: 11.5 g/dL — ABNORMAL LOW (ref 12.0–15.0)
MCH: 24.5 pg — ABNORMAL LOW (ref 26.0–34.0)
MCHC: 30 g/dL (ref 30.0–36.0)
MCV: 81.5 fL (ref 80.0–100.0)
Platelets: 416 10*3/uL — ABNORMAL HIGH (ref 150–400)
RBC: 4.7 MIL/uL (ref 3.87–5.11)
RDW: 13.7 % (ref 11.5–15.5)
WBC: 8.4 10*3/uL (ref 4.0–10.5)
nRBC: 0 % (ref 0.0–0.2)

## 2019-04-24 LAB — HEMOGLOBIN A1C
Hgb A1c MFr Bld: 6.4 % — ABNORMAL HIGH (ref 4.8–5.6)
Mean Plasma Glucose: 136.98 mg/dL

## 2019-04-24 LAB — COMPREHENSIVE METABOLIC PANEL
ALT: 19 U/L (ref 0–44)
AST: 18 U/L (ref 15–41)
Albumin: 3.8 g/dL (ref 3.5–5.0)
Alkaline Phosphatase: 104 U/L (ref 38–126)
Anion gap: 9 (ref 5–15)
BUN: 13 mg/dL (ref 8–23)
CO2: 29 mmol/L (ref 22–32)
Calcium: 9.5 mg/dL (ref 8.9–10.3)
Chloride: 101 mmol/L (ref 98–111)
Creatinine, Ser: 0.95 mg/dL (ref 0.44–1.00)
GFR calc Af Amer: >60 mL/min (ref >60)
GFR calc non Af Amer: >60 mL/min (ref >60)
Glucose, Bld: 88 mg/dL (ref 70–99)
Potassium: 4.4 mmol/L (ref 3.5–5.1)
Sodium: 139 mmol/L (ref 135–145)
Total Bilirubin: 0.5 mg/dL (ref 0.3–1.2)
Total Protein: 8.2 g/dL — ABNORMAL HIGH (ref 6.5–8.1)

## 2019-04-24 LAB — PROTIME-INR
INR: 1 (ref 0.8–1.2)
Prothrombin Time: 13 seconds (ref 11.4–15.2)

## 2019-04-24 LAB — GLUCOSE, CAPILLARY: Glucose-Capillary: 91 mg/dL (ref 70–99)

## 2019-04-24 LAB — APTT: aPTT: 31 seconds (ref 24–36)

## 2019-04-25 ENCOUNTER — Other Ambulatory Visit (HOSPITAL_COMMUNITY)
Admission: RE | Admit: 2019-04-25 | Discharge: 2019-04-25 | Disposition: A | Discharge status: Home / Self Care | Payer: Managed Care, Other (non HMO) | Source: Ambulatory Visit | Attending: Orthopedic Surgery | Admitting: Orthopedic Surgery

## 2019-04-25 DIAGNOSIS — Z01818 Encounter for other preprocedural examination: Secondary | ICD-10-CM | POA: Diagnosis not present

## 2019-04-25 LAB — ABO/RH: ABO/RH(D): O POS

## 2019-04-25 LAB — SURGICAL PCR SCREEN
MRSA, PCR: POSITIVE — AB
Staphylococcus aureus: POSITIVE — AB

## 2019-04-26 LAB — SARS CORONAVIRUS 2 (TAT 6-24 HRS): SARS Coronavirus 2: NEGATIVE

## 2019-04-26 NOTE — Progress Notes (Signed)
Anesthesia Chart Review   Case: [id number] Date/Time: 04/29/19 1015   Procedure: TOTAL KNEE ARTHROPLASTY (Left ) - 19min   Anesthesia type: Choice   Pre-op diagnosis: left knee osteoarthritis   Location: [facility] ROOM 10 / [facility]   Surgeon: Gaynelle Arabian, MD      DISCUSSION:63 y.o. never smoker with h/o GERD, HTN, DM II, left knee OA scheduled for above procedure 04/29/2019 with Dr. Gaynelle Arabian.   Low risk stress test 08/09/17.   Anticipate pt can proceed with planned procedure barring acute status change.   VS: BP (!) 169/98 (BP Location: Right Arm)   Pulse 95   Temp 36.8 C (Oral)   Resp 18   Ht 5\' 6"  (1.676 m)   Wt 99.3 kg   SpO2 99%   BMI 35.35 kg/m   PROVIDERS: Arsenio Katz, NP is PCP   Kate Sable, MD is Cardiologist  LABS: Labs reviewed: Acceptable for surgery. (all labs ordered are listed, but only abnormal results are displayed)  Labs Reviewed  SURGICAL PCR SCREEN - Abnormal; Notable for the following components:      Result Value   MRSA, PCR POSITIVE (*)    Staphylococcus aureus POSITIVE (*)    All other components within normal limits  CBC - Abnormal; Notable for the following components:   Hemoglobin 11.5 (*)    MCH 24.5 (*)    Platelets 416 (*)    All other components within normal limits  COMPREHENSIVE METABOLIC PANEL - Abnormal; Notable for the following components:   Total Protein 8.2 (*)    All other components within normal limits  HEMOGLOBIN A1C - Abnormal; Notable for the following components:   Hgb A1c MFr Bld 6.4 (*)    All other components within normal limits  GLUCOSE, CAPILLARY  APTT  PROTIME-INR  TYPE AND SCREEN  ABO/RH     IMAGES:   EKG: 04/24/2019 Rate 96 bpm Normal sinus rhythm  Possible left atrial enlargement Borderline ECG   CV: Myocardial Perfusion 08/09/17  There was no ST segment deviation noted during stress.  The study is normal. There are no perfusion defects.  This is a low risk study.  The left  ventricular ejection fraction is normal (55-65%).  Echo 06/07/17 Study Conclusions  - Left ventricle: The cavity size was normal. Wall thickness was   normal. Systolic function was vigorous. The estimated ejection   fraction was in the range of 65% to 70%. Left ventricular   diastolic function parameters were normal. - Aortic valve: Moderately calcified annulus. Trileaflet. Past Medical History:  Diagnosis Date  . Arthritis   . 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 Constipation   . Depression   . Dyspnea   . Eczema   . GERD (gastroesophageal reflux disease)   . Gross hematuria   . Heart murmur   . Hypertension   . Type 2 diabetes mellitus ([place])     Past Surgical History:  Procedure Laterality Date  . hand surgery Left 1982    MEDICATIONS: . Semaglutide (RYBELSUS) 7 MG TABS  . acetaminophen (TYLENOL) 500 MG tablet  . amLODipine (NORVASC) 5 MG tablet  . Biotin 10 MG CAPS  . Cinnamon 500 MG TABS  . diclofenac (VOLTAREN) 75 MG EC tablet  . FLUoxetine (PROZAC) 40 MG capsule  . gabapentin (NEURONTIN) 300 MG capsule  . glipiZIDE (GLUCOTROL) 10 MG tablet  . Lactobacillus (PROBIOTIC ACIDOPHILUS PO)  . metFORMIN (GLUCOPHAGE) 500 MG tablet  . Multiple Vitamin (MULTIVITAMIN) tablet  .  Omega-3 Fatty Acids (FISH OIL) 1000 MG CAPS  . pantoprazole (PROTONIX) 40 MG tablet   No current facility-administered medications for this encounter.      Maia Plan [facility] Pre-Surgical Testing [phone] 04/26/19  9:48 AM

## 2019-04-28 MED ORDER — BUPIVACAINE LIPOSOME 1.3 % IJ SUSP
20.0000 mL | INTRAMUSCULAR | Status: DC
  Filled 2019-04-28: qty 20

## 2019-04-29 ENCOUNTER — Other Ambulatory Visit: Payer: Self-pay

## 2019-04-29 ENCOUNTER — Inpatient Hospital Stay (HOSPITAL_COMMUNITY): Payer: Managed Care, Other (non HMO) | Admitting: Anesthesiology

## 2019-04-29 ENCOUNTER — Encounter (HOSPITAL_COMMUNITY): Payer: Self-pay | Admitting: Emergency Medicine

## 2019-04-29 ENCOUNTER — Inpatient Hospital Stay (HOSPITAL_COMMUNITY): Payer: Managed Care, Other (non HMO) | Admitting: Physician Assistant

## 2019-04-29 ENCOUNTER — Encounter (HOSPITAL_COMMUNITY)
Admission: RE | Disposition: A | Discharge status: Home / Self Care | Payer: Self-pay | Source: Home / Self Care | Attending: Orthopedic Surgery

## 2019-04-29 ENCOUNTER — Observation Stay (HOSPITAL_COMMUNITY)
Admission: RE | Admit: 2019-04-29 | Discharge: 2019-04-30 | Disposition: A | Discharge status: Home / Self Care | Payer: Managed Care, Other (non HMO) | Attending: Orthopedic Surgery | Admitting: Orthopedic Surgery

## 2019-04-29 DIAGNOSIS — E119 Type 2 diabetes mellitus without complications: Secondary | ICD-10-CM | POA: Insufficient documentation

## 2019-04-29 DIAGNOSIS — Z79899 Other long term (current) drug therapy: Secondary | ICD-10-CM | POA: Insufficient documentation

## 2019-04-29 DIAGNOSIS — I10 Essential (primary) hypertension: Secondary | ICD-10-CM | POA: Insufficient documentation

## 2019-04-29 DIAGNOSIS — Z7984 Long term (current) use of oral hypoglycemic drugs: Secondary | ICD-10-CM | POA: Diagnosis not present

## 2019-04-29 DIAGNOSIS — K219 Gastro-esophageal reflux disease without esophagitis: Secondary | ICD-10-CM | POA: Insufficient documentation

## 2019-04-29 DIAGNOSIS — M179 Osteoarthritis of knee, unspecified: Secondary | ICD-10-CM

## 2019-04-29 DIAGNOSIS — F329 Major depressive disorder, single episode, unspecified: Secondary | ICD-10-CM | POA: Insufficient documentation

## 2019-04-29 DIAGNOSIS — Z6835 Body mass index (BMI) 35.0-35.9, adult: Secondary | ICD-10-CM | POA: Diagnosis not present

## 2019-04-29 DIAGNOSIS — Z791 Long term (current) use of non-steroidal anti-inflammatories (NSAID): Secondary | ICD-10-CM | POA: Insufficient documentation

## 2019-04-29 DIAGNOSIS — M1710 Unilateral primary osteoarthritis, unspecified knee: Secondary | ICD-10-CM | POA: Diagnosis present

## 2019-04-29 DIAGNOSIS — M1712 Unilateral primary osteoarthritis, left knee: Secondary | ICD-10-CM | POA: Diagnosis present

## 2019-04-29 HISTORY — PX: TOTAL KNEE ARTHROPLASTY: SHX125

## 2019-04-29 LAB — TYPE AND SCREEN
ABO/RH(D): O POS
Antibody Screen: NEGATIVE

## 2019-04-29 LAB — GLUCOSE, CAPILLARY
Glucose-Capillary: 198 mg/dL — ABNORMAL HIGH (ref 70–99)
Glucose-Capillary: 87 mg/dL (ref 70–99)
Glucose-Capillary: 101 mg/dL — ABNORMAL HIGH (ref 70–99)
Glucose-Capillary: 156 mg/dL — ABNORMAL HIGH (ref 70–99)

## 2019-04-29 SURGERY — ARTHROPLASTY, KNEE, TOTAL
Anesthesia: Spinal | Site: Knee | Laterality: Left

## 2019-04-29 MED ORDER — INSULIN ASPART 100 UNIT/ML ~~LOC~~ SOLN
0.0000 [IU] | Freq: Three times a day (TID) | SUBCUTANEOUS | Status: DC
  Administered 2019-04-30 (×2): 3 [IU] via SUBCUTANEOUS

## 2019-04-29 MED ORDER — ONDANSETRON HCL 4 MG/2ML IJ SOLN
INTRAMUSCULAR | Status: DC | PRN
  Administered 2019-04-29: 4 mg via INTRAVENOUS

## 2019-04-29 MED ORDER — OXYCODONE HCL 5 MG/5ML PO SOLN: 5.0000 mg | Freq: Once | ORAL | Status: DC | PRN

## 2019-04-29 MED ORDER — PROPOFOL 10 MG/ML IV BOLUS
INTRAVENOUS | Status: AC
  Filled 2019-04-29: qty 60

## 2019-04-29 MED ORDER — LACTATED RINGERS IV SOLN
INTRAVENOUS | Status: DC
  Administered 2019-04-29: 08:00:00 via INTRAVENOUS

## 2019-04-29 MED ORDER — CLONIDINE HCL (ANALGESIA) 100 MCG/ML EP SOLN
EPIDURAL | Status: DC | PRN
  Administered 2019-04-29: 100 ug

## 2019-04-29 MED ORDER — RIVAROXABAN 10 MG PO TABS
10.0000 mg | ORAL_TABLET | Freq: Every day | ORAL | Status: DC
  Administered 2019-04-30: 10 mg via ORAL
  Filled 2019-04-29: qty 1

## 2019-04-29 MED ORDER — POLYETHYLENE GLYCOL 3350 17 G PO PACK: 17.0000 g | PACK | Freq: Every day | ORAL | Status: DC | PRN

## 2019-04-29 MED ORDER — ONDANSETRON HCL 4 MG/2ML IJ SOLN: 4.0000 mg | Freq: Once | INTRAMUSCULAR | Status: DC | PRN

## 2019-04-29 MED ORDER — MENTHOL 3 MG MT LOZG
1.0000 | LOZENGE | OROMUCOSAL | Status: DC | PRN
  Administered 2019-04-29: 3 mg via ORAL
  Filled 2019-04-29: qty 9

## 2019-04-29 MED ORDER — FENTANYL CITRATE (PF) 100 MCG/2ML IJ SOLN: 25.0000 ug | INTRAMUSCULAR | Status: DC | PRN

## 2019-04-29 MED ORDER — PHENOL 1.4 % MT LIQD
1.0000 | OROMUCOSAL | Status: DC | PRN
  Filled 2019-04-29: qty 177

## 2019-04-29 MED ORDER — SEMAGLUTIDE 7 MG PO TABS: 7.0000 mg | ORAL_TABLET | Freq: Every day | ORAL | Status: DC

## 2019-04-29 MED ORDER — LIDOCAINE 2% (20 MG/ML) 5 ML SYRINGE
INTRAMUSCULAR | Status: AC
  Filled 2019-04-29: qty 5

## 2019-04-29 MED ORDER — FENTANYL CITRATE (PF) 100 MCG/2ML IJ SOLN
INTRAMUSCULAR | Status: AC
  Filled 2019-04-29: qty 2

## 2019-04-29 MED ORDER — CHLORHEXIDINE GLUCONATE 4 % EX LIQD: 60.0000 mL | Freq: Once | CUTANEOUS | Status: DC

## 2019-04-29 MED ORDER — SODIUM CHLORIDE 0.9 % IV SOLN
INTRAVENOUS | Status: DC
  Administered 2019-04-29 – 2019-04-30 (×3): via INTRAVENOUS

## 2019-04-29 MED ORDER — MEPERIDINE HCL 50 MG/ML IJ SOLN: 6.2500 mg | INTRAMUSCULAR | Status: DC | PRN

## 2019-04-29 MED ORDER — SODIUM CHLORIDE 0.9 % IR SOLN
Status: DC | PRN
  Administered 2019-04-29: 1000 mL

## 2019-04-29 MED ORDER — CEFAZOLIN SODIUM-DEXTROSE 2-4 GM/100ML-% IV SOLN
2.0000 g | INTRAVENOUS | Status: AC
  Administered 2019-04-29: 2 g via INTRAVENOUS
  Filled 2019-04-29: qty 100

## 2019-04-29 MED ORDER — PROPOFOL 10 MG/ML IV BOLUS
INTRAVENOUS | Status: DC | PRN
  Administered 2019-04-29: 40 mg via INTRAVENOUS

## 2019-04-29 MED ORDER — METHOCARBAMOL 500 MG IVPB - SIMPLE MED
500.0000 mg | Freq: Four times a day (QID) | INTRAVENOUS | Status: DC | PRN
  Filled 2019-04-29: qty 50

## 2019-04-29 MED ORDER — METOCLOPRAMIDE HCL 5 MG PO TABS: 5.0000 mg | ORAL_TABLET | Freq: Three times a day (TID) | ORAL | Status: DC | PRN

## 2019-04-29 MED ORDER — EPHEDRINE 5 MG/ML INJ
INTRAVENOUS | Status: AC
  Filled 2019-04-29: qty 10

## 2019-04-29 MED ORDER — OXYCODONE HCL 5 MG PO TABS: 5.0000 mg | ORAL_TABLET | Freq: Once | ORAL | Status: DC | PRN

## 2019-04-29 MED ORDER — ONDANSETRON HCL 4 MG PO TABS
4.0000 mg | ORAL_TABLET | Freq: Four times a day (QID) | ORAL | Status: DC | PRN
  Administered 2019-04-30: 4 mg via ORAL
  Filled 2019-04-29: qty 1

## 2019-04-29 MED ORDER — FLUOXETINE HCL 20 MG PO CAPS
40.0000 mg | ORAL_CAPSULE | Freq: Every evening | ORAL | Status: DC
  Administered 2019-04-29: 40 mg via ORAL
  Filled 2019-04-29: qty 2

## 2019-04-29 MED ORDER — SODIUM CHLORIDE (PF) 0.9 % IJ SOLN
INTRAMUSCULAR | Status: DC | PRN
  Administered 2019-04-29: 60 mL via INTRAVENOUS

## 2019-04-29 MED ORDER — FENTANYL CITRATE (PF) 100 MCG/2ML IJ SOLN
50.0000 ug | INTRAMUSCULAR | Status: DC
  Administered 2019-04-29: 100 ug via INTRAVENOUS
  Filled 2019-04-29: qty 2

## 2019-04-29 MED ORDER — AMLODIPINE BESYLATE 5 MG PO TABS
5.0000 mg | ORAL_TABLET | Freq: Every evening | ORAL | Status: DC
  Administered 2019-04-29: 5 mg via ORAL
  Filled 2019-04-29: qty 1

## 2019-04-29 MED ORDER — BUPIVACAINE IN DEXTROSE 0.75-8.25 % IT SOLN
INTRATHECAL | Status: DC | PRN
  Administered 2019-04-29: 1.6 mL via INTRATHECAL

## 2019-04-29 MED ORDER — ACETAMINOPHEN 160 MG/5ML PO SOLN: 325.0000 mg | ORAL | Status: DC | PRN

## 2019-04-29 MED ORDER — PROPOFOL 500 MG/50ML IV EMUL
INTRAVENOUS | Status: DC | PRN
  Administered 2019-04-29: 80 ug/kg/min via INTRAVENOUS

## 2019-04-29 MED ORDER — ACETAMINOPHEN 10 MG/ML IV SOLN
1000.0000 mg | Freq: Four times a day (QID) | INTRAVENOUS | Status: DC
  Administered 2019-04-29: 12:00:00 1000 mg via INTRAVENOUS
  Filled 2019-04-29: qty 100

## 2019-04-29 MED ORDER — TRANEXAMIC ACID-NACL 1000-0.7 MG/100ML-% IV SOLN
1000.0000 mg | Freq: Once | INTRAVENOUS | Status: AC
  Administered 2019-04-29: 15:00:00 1000 mg via INTRAVENOUS
  Filled 2019-04-29: qty 100

## 2019-04-29 MED ORDER — POVIDONE-IODINE 10 % EX SWAB
2.0000 "application " | Freq: Once | CUTANEOUS | Status: AC
  Administered 2019-04-29: 2 via TOPICAL

## 2019-04-29 MED ORDER — ONDANSETRON HCL 4 MG/2ML IJ SOLN
INTRAMUSCULAR | Status: AC
  Filled 2019-04-29: qty 2

## 2019-04-29 MED ORDER — ACETAMINOPHEN 500 MG PO TABS
1000.0000 mg | ORAL_TABLET | Freq: Four times a day (QID) | ORAL | Status: AC
  Administered 2019-04-29 – 2019-04-30 (×4): 1000 mg via ORAL
  Filled 2019-04-29 (×4): qty 2

## 2019-04-29 MED ORDER — MIDAZOLAM HCL 2 MG/2ML IJ SOLN
1.0000 mg | INTRAMUSCULAR | Status: DC
  Administered 2019-04-29: 2 mg via INTRAVENOUS
  Filled 2019-04-29: qty 2

## 2019-04-29 MED ORDER — DIPHENHYDRAMINE HCL 12.5 MG/5ML PO ELIX: 12.5000 mg | ORAL_SOLUTION | ORAL | Status: DC | PRN

## 2019-04-29 MED ORDER — DEXAMETHASONE SODIUM PHOSPHATE 10 MG/ML IJ SOLN
8.0000 mg | Freq: Once | INTRAMUSCULAR | Status: AC
  Administered 2019-04-29: 8 mg via INTRAVENOUS

## 2019-04-29 MED ORDER — FLEET ENEMA 7-19 GM/118ML RE ENEM: 1.0000 | ENEMA | Freq: Once | RECTAL | Status: DC | PRN

## 2019-04-29 MED ORDER — SODIUM CHLORIDE (PF) 0.9 % IJ SOLN
INTRAMUSCULAR | Status: AC
  Filled 2019-04-29: qty 10

## 2019-04-29 MED ORDER — CEFAZOLIN SODIUM-DEXTROSE 2-4 GM/100ML-% IV SOLN
2.0000 g | Freq: Four times a day (QID) | INTRAVENOUS | Status: AC
  Administered 2019-04-29 (×2): 2 g via INTRAVENOUS
  Filled 2019-04-29 (×2): qty 100

## 2019-04-29 MED ORDER — METOCLOPRAMIDE HCL 5 MG/ML IJ SOLN: 5.0000 mg | Freq: Three times a day (TID) | INTRAMUSCULAR | Status: DC | PRN

## 2019-04-29 MED ORDER — STERILE WATER FOR IRRIGATION IR SOLN
Status: DC | PRN
  Administered 2019-04-29 (×2): 1000 mL

## 2019-04-29 MED ORDER — ROPIVACAINE HCL 7.5 MG/ML IJ SOLN
INTRAMUSCULAR | Status: DC | PRN
  Administered 2019-04-29: 25 mL via PERINEURAL

## 2019-04-29 MED ORDER — BUPIVACAINE LIPOSOME 1.3 % IJ SUSP
INTRAMUSCULAR | Status: DC | PRN
  Administered 2019-04-29: 20 mL

## 2019-04-29 MED ORDER — TRANEXAMIC ACID-NACL 1000-0.7 MG/100ML-% IV SOLN
1000.0000 mg | INTRAVENOUS | Status: AC
  Administered 2019-04-29: 11:00:00 1000 mg via INTRAVENOUS
  Filled 2019-04-29: qty 100

## 2019-04-29 MED ORDER — PANTOPRAZOLE SODIUM 40 MG PO TBEC
40.0000 mg | DELAYED_RELEASE_TABLET | Freq: Every day | ORAL | Status: DC
  Administered 2019-04-30: 40 mg via ORAL
  Filled 2019-04-29: qty 1

## 2019-04-29 MED ORDER — GABAPENTIN 300 MG PO CAPS
300.0000 mg | ORAL_CAPSULE | Freq: Three times a day (TID) | ORAL | Status: DC
  Administered 2019-04-29 – 2019-04-30 (×4): 300 mg via ORAL
  Filled 2019-04-29 (×4): qty 1

## 2019-04-29 MED ORDER — BISACODYL 10 MG RE SUPP: 10.0000 mg | Freq: Every day | RECTAL | Status: DC | PRN

## 2019-04-29 MED ORDER — GLIPIZIDE 10 MG PO TABS
10.0000 mg | ORAL_TABLET | Freq: Every day | ORAL | Status: DC
  Administered 2019-04-30: 10 mg via ORAL
  Filled 2019-04-29: qty 1

## 2019-04-29 MED ORDER — ACETAMINOPHEN 325 MG PO TABS: 325.0000 mg | ORAL_TABLET | ORAL | Status: DC | PRN

## 2019-04-29 MED ORDER — MORPHINE SULFATE (PF) 2 MG/ML IV SOLN
0.5000 mg | INTRAVENOUS | Status: DC | PRN
  Administered 2019-04-29: 1 mg via INTRAVENOUS
  Filled 2019-04-29: qty 1

## 2019-04-29 MED ORDER — ONDANSETRON HCL 4 MG/2ML IJ SOLN: 4.0000 mg | Freq: Four times a day (QID) | INTRAMUSCULAR | Status: DC | PRN

## 2019-04-29 MED ORDER — SODIUM CHLORIDE (PF) 0.9 % IJ SOLN
INTRAMUSCULAR | Status: AC
  Filled 2019-04-29: qty 50

## 2019-04-29 MED ORDER — OXYCODONE HCL 5 MG PO TABS
10.0000 mg | ORAL_TABLET | ORAL | Status: DC | PRN
  Administered 2019-04-29 – 2019-04-30 (×6): 10 mg via ORAL
  Filled 2019-04-29 (×6): qty 2

## 2019-04-29 MED ORDER — DEXAMETHASONE SODIUM PHOSPHATE 10 MG/ML IJ SOLN
10.0000 mg | Freq: Once | INTRAMUSCULAR | Status: AC
  Administered 2019-04-30: 10 mg via INTRAVENOUS
  Filled 2019-04-29: qty 1

## 2019-04-29 MED ORDER — OXYCODONE HCL 5 MG PO TABS: 5.0000 mg | ORAL_TABLET | ORAL | Status: DC | PRN

## 2019-04-29 MED ORDER — DOCUSATE SODIUM 100 MG PO CAPS
100.0000 mg | ORAL_CAPSULE | Freq: Two times a day (BID) | ORAL | Status: DC
  Administered 2019-04-29 – 2019-04-30 (×2): 100 mg via ORAL
  Filled 2019-04-29 (×2): qty 1

## 2019-04-29 MED ORDER — METHOCARBAMOL 500 MG PO TABS
500.0000 mg | ORAL_TABLET | Freq: Four times a day (QID) | ORAL | Status: DC | PRN
  Administered 2019-04-29 – 2019-04-30 (×3): 500 mg via ORAL
  Filled 2019-04-29 (×3): qty 1

## 2019-04-29 MED ORDER — VANCOMYCIN HCL IN DEXTROSE 1-5 GM/200ML-% IV SOLN
1000.0000 mg | Freq: Once | INTRAVENOUS | Status: AC
  Administered 2019-04-29 (×2): 1000 mg via INTRAVENOUS
  Filled 2019-04-29: qty 200

## 2019-04-29 SURGICAL SUPPLY — 57 items
ATTUNE MED DOME PAT 38 KNEE (Knees) ×2 IMPLANT
ATTUNE PS FEM LT SZ 5 CEM KNEE (Femur) ×2 IMPLANT
ATTUNE PSRP INSR SZ 5 10M KNEE (Insert) ×2 IMPLANT
BAG ZIPLOCK 12X15 (MISCELLANEOUS) ×2 IMPLANT
BASE TIBIAL ROT PLAT SZ 5 KNEE (Knees) ×1 IMPLANT
BLADE SAG 18X100X1.27 (BLADE) ×2 IMPLANT
BLADE SAW SGTL 11.0X1.19X90.0M (BLADE) ×2 IMPLANT
BLADE SURG SZ10 CARB STEEL (BLADE) ×4 IMPLANT
BNDG ELASTIC 6X5.8 VLCR STR LF (GAUZE/BANDAGES/DRESSINGS) ×2 IMPLANT
BOWL SMART MIX CTS (DISPOSABLE) ×2 IMPLANT
CEMENT HV SMART SET (Cement) ×4 IMPLANT
CLSR STERI-STRIP ANTIMIC 1/2X4 (GAUZE/BANDAGES/DRESSINGS) ×2 IMPLANT
COVER SURGICAL LIGHT HANDLE (MISCELLANEOUS) ×2 IMPLANT
COVER WAND RF STERILE (DRAPES) IMPLANT
CUFF TOURN SGL QUICK 34 (TOURNIQUET CUFF) ×1
CUFF TRNQT CYL 34X4.125X (TOURNIQUET CUFF) ×1 IMPLANT
DECANTER SPIKE VIAL GLASS SM (MISCELLANEOUS) ×2 IMPLANT
DRAPE U-SHAPE 47X51 STRL (DRAPES) ×2 IMPLANT
DRSG ADAPTIC 3X8 NADH LF (GAUZE/BANDAGES/DRESSINGS) ×2 IMPLANT
DRSG PAD ABDOMINAL 8X10 ST (GAUZE/BANDAGES/DRESSINGS) ×2 IMPLANT
DURAPREP 26ML APPLICATOR (WOUND CARE) ×2 IMPLANT
ELECT REM PT RETURN 15FT ADLT (MISCELLANEOUS) ×2 IMPLANT
EVACUATOR 1/8 PVC DRAIN (DRAIN) ×2 IMPLANT
GAUZE SPONGE 4X4 12PLY STRL (GAUZE/BANDAGES/DRESSINGS) ×2 IMPLANT
GLOVE BIO SURGEON STRL SZ7 (GLOVE) ×2 IMPLANT
GLOVE BIO SURGEON STRL SZ8 (GLOVE) ×2 IMPLANT
GLOVE BIOGEL PI IND STRL 6.5 (GLOVE) ×1 IMPLANT
GLOVE BIOGEL PI IND STRL 7.0 (GLOVE) ×1 IMPLANT
GLOVE BIOGEL PI IND STRL 8 (GLOVE) ×1 IMPLANT
GLOVE BIOGEL PI INDICATOR 6.5 (GLOVE) ×1
GLOVE BIOGEL PI INDICATOR 7.0 (GLOVE) ×1
GLOVE BIOGEL PI INDICATOR 8 (GLOVE) ×1
GLOVE SURG SS PI 6.5 STRL IVOR (GLOVE) ×2 IMPLANT
GOWN STRL REUS W/TWL LRG LVL3 (GOWN DISPOSABLE) ×6 IMPLANT
HANDPIECE INTERPULSE COAX TIP (DISPOSABLE) ×1
HOLDER FOLEY CATH W/STRAP (MISCELLANEOUS) IMPLANT
IMMOBILIZER KNEE 20 (SOFTGOODS) ×2
IMMOBILIZER KNEE 20 THIGH 36 (SOFTGOODS) ×1 IMPLANT
KIT TURNOVER KIT A (KITS) IMPLANT
MANIFOLD NEPTUNE II (INSTRUMENTS) ×2 IMPLANT
NS IRRIG 1000ML POUR BTL (IV SOLUTION) ×2 IMPLANT
PACK TOTAL KNEE CUSTOM (KITS) ×2 IMPLANT
PADDING CAST COTTON 6X4 STRL (CAST SUPPLIES) ×2 IMPLANT
PIN STEINMAN FIXATION KNEE (PIN) ×2 IMPLANT
PROTECTOR NERVE ULNAR (MISCELLANEOUS) ×2 IMPLANT
SET HNDPC FAN SPRY TIP SCT (DISPOSABLE) ×1 IMPLANT
STRIP CLOSURE SKIN 1/2X4 (GAUZE/BANDAGES/DRESSINGS) ×4 IMPLANT
SUT MNCRL AB 4-0 PS2 18 (SUTURE) ×2 IMPLANT
SUT STRATAFIX 0 PDS 27 VIOLET (SUTURE) ×2
SUT VIC AB 2-0 CT1 27 (SUTURE) ×3
SUT VIC AB 2-0 CT1 TAPERPNT 27 (SUTURE) ×3 IMPLANT
SUTURE STRATFX 0 PDS 27 VIOLET (SUTURE) ×1 IMPLANT
TIBIAL BASE ROT PLAT SZ 5 KNEE (Knees) ×2 IMPLANT
TRAY FOLEY MTR SLVR 16FR STAT (SET/KITS/TRAYS/PACK) ×2 IMPLANT
WATER STERILE IRR 1000ML POUR (IV SOLUTION) ×4 IMPLANT
WRAP KNEE MAXI GEL POST OP (GAUZE/BANDAGES/DRESSINGS) ×2 IMPLANT
YANKAUER SUCT BULB TIP 10FT TU (MISCELLANEOUS) ×2 IMPLANT

## 2019-04-29 NOTE — Transfer of Care (Signed)
Immediate Anesthesia Transfer of Care Note  Patient: Krista [name]  Procedure(s) Performed: TOTAL KNEE ARTHROPLASTY (Left Knee)  Patient Location: PACU  Anesthesia Type:Spinal  Level of Consciousness: awake, alert  and oriented  Airway & Oxygen Therapy: Patient Spontanous Breathing and Patient connected to face mask oxygen  Post-op Assessment: Report given to RN and Post -op Vital signs reviewed and stable  Post vital signs: Reviewed and stable  Last Vitals:  Vitals Value Taken Time  BP 122/71 04/29/19 1225  Temp    Pulse 87 04/29/19 1227  Resp    SpO2 100 % 04/29/19 1227  Vitals shown include unvalidated device data.  Last Pain:  Vitals:   04/29/19 1017  TempSrc:   PainSc: 0-No pain      Patients Stated Pain Goal: 4 (17/00/17 4944)  Complications: No apparent anesthesia complications

## 2019-04-29 NOTE — Anesthesia Preprocedure Evaluation (Addendum)
Anesthesia Evaluation  Patient identified by MRN, date of birth, ID band Patient awake    Reviewed: Allergy & Precautions, H&P , NPO status , Patient's Chart, lab work & pertinent test results, reviewed documented beta blocker date and time   Airway Mallampati: III  TM Distance: >3 FB Neck ROM: full    Dental no notable dental hx. (+) Poor Dentition, Chipped, Missing, Loose, Caps,    Pulmonary neg pulmonary ROS,    Pulmonary exam normal breath sounds clear to auscultation       Cardiovascular Exercise Tolerance: Good hypertension, Pt. on medications  Rhythm:regular Rate:Normal  EKG: 04/24/2019 Rate 96 bpm Normal sinus rhythm  Possible left atrial enlargement Borderline ECG   CV: Myocardial Perfusion 08/09/17  There was no ST segment deviation noted during stress.  The study is normal. There are no perfusion defects.  This is a low risk study.  The left ventricular ejection fraction is normal (55-65%).  Echo 06/07/17 Study Conclusions  - Left ventricle: The cavity size was normal. Wall thickness was normal. Systolic function was vigorous. The estimated ejection fraction was in the range of 65% to 70%. Left ventricular diastolic function parameters were normal. - Aortic valve: Moderately calcified annulus. Trileaflet.   Neuro/Psych PSYCHIATRIC DISORDERS Depression negative neurological ROS     GI/Hepatic Neg liver ROS, GERD  Medicated,  Endo/Other  diabetes, Type 2  Renal/GU negative Renal ROS  negative genitourinary   Musculoskeletal  (+) Arthritis , Osteoarthritis,    Abdominal   Peds  Hematology negative hematology ROS (+)   Anesthesia Other Findings   Reproductive/Obstetrics negative OB ROS                            Anesthesia Physical Anesthesia Plan  ASA: III  Anesthesia Plan: Spinal   Post-op Pain Management:  Regional for Post-op pain   Induction:    PONV Risk Score and Plan: 2 and Treatment may vary due to age or medical condition  Airway Management Planned: Nasal Cannula and Simple Face Mask  Additional Equipment:   Intra-op Plan:   Post-operative Plan:   Informed Consent: I have reviewed the patients History and Physical, chart, labs and discussed the procedure including the risks, benefits and alternatives for the proposed anesthesia with the patient or authorized representative who has indicated his/her understanding and acceptance.     Dental Advisory Given  Plan Discussed with: CRNA, Anesthesiologist and Surgeon  Anesthesia Plan Comments:         Anesthesia Quick Evaluation

## 2019-04-29 NOTE — Op Note (Signed)
OPERATIVE REPORT-TOTAL KNEE ARTHROPLASTY   Pre-operative diagnosis- Osteoarthritis  Left knee(s)  Post-operative diagnosis- Osteoarthritis Left knee(s)  Procedure-  Left  Total Knee Arthroplasty  Surgeon- Dione Plover. Zeya Balles, MD  Assistant- Theresa Duty, PA-C   Anesthesia-  Adductor canal block and spinal  EBL- 25 ml   Drains Hemovac  Tourniquet time-  Total Tourniquet Time Documented: Thigh (Left) - 47 minutes Total: Thigh (Left) - 47 minutes     Complications- None  Condition-PACU - hemodynamically stable.   Brief Clinical Note  Krista [name] is a 63 y.o. year old female with end stage OA of her left knee with progressively worsening pain and dysfunction. She has constant pain, with activity and at rest and significant functional deficits with difficulties even with ADLs. She has had extensive non-op management including analgesics, injections of cortisone and viscosupplements, and home exercise program, but remains in significant pain with significant dysfunction. Radiographs show bone on bone arthritis medial and patellofemoral. She presents now for left Total Knee Arthroplasty.    Procedure in detail---   The patient is brought into the operating room and positioned supine on the operating table. After successful administration of  Adductor canal block and spinal,   a tourniquet is placed high on the  Left thigh(s) and the lower extremity is prepped and draped in the usual sterile fashion. Time out is performed by the operating team and then the  Left lower extremity is wrapped in Esmarch, knee flexed and the tourniquet inflated to 300 mmHg.       A midline incision is made with a ten blade through the subcutaneous tissue to the level of the extensor mechanism. A fresh blade is used to make a medial parapatellar arthrotomy. Soft tissue over the proximal medial tibia is subperiosteally elevated to the joint line with a knife and into the semimembranosus bursa with a Cobb  elevator. Soft tissue over the proximal lateral tibia is elevated with attention being paid to avoiding the patellar tendon on the tibial tubercle. The patella is everted, knee flexed 90 degrees and the ACL and PCL are removed. Findings are bone on bone medial and patellofemoral with large global osteophytes        The drill is used to create a starting hole in the distal femur and the canal is thoroughly irrigated with sterile saline to remove the fatty contents. The 5 degree Left  valgus alignment guide is placed into the femoral canal and the distal femoral cutting block is pinned to remove 9 mm off the distal femur. Resection is made with an oscillating saw.      The tibia is subluxed forward and the menisci are removed. The extramedullary alignment guide is placed referencing proximally at the medial aspect of the tibial tubercle and distally along the second metatarsal axis and tibial crest. The block is pinned to remove 58mm off the more deficient medial  side. Resection is made with an oscillating saw. Size 5is the most appropriate size for the tibia and the proximal tibia is prepared with the modular drill and keel punch for that size.      The femoral sizing guide is placed and size 5 is most appropriate. Rotation is marked off the epicondylar axis and confirmed by creating a rectangular flexion gap at 90 degrees. The size 5 cutting block is pinned in this rotation and the anterior, posterior and chamfer cuts are made with the oscillating saw. The intercondylar block is then placed and that cut is made.  Trial size 5 tibial component, trial size 5 posterior stabilized femur and a 10  mm posterior stabilized rotating platform insert trial is placed. Full extension is achieved with excellent varus/valgus and anterior/posterior balance throughout full range of motion. The patella is everted and thickness measured to be 24  mm. Free hand resection is taken to 14 mm, a 38 template is placed, lug holes  are drilled, trial patella is placed, and it tracks normally. Osteophytes are removed off the posterior femur with the trial in place. All trials are removed and the cut bone surfaces prepared with pulsatile lavage. Cement is mixed and once ready for implantation, the size 5 tibial implant, size  5 posterior stabilized femoral component, and the size 38 patella are cemented in place and the patella is held with the clamp. The trial insert is placed and the knee held in full extension. The Exparel (20 ml mixed with 60 ml saline) is injected into the extensor mechanism, posterior capsule, medial and lateral gutters and subcutaneous tissues.  All extruded cement is removed and once the cement is hard the permanent 10 mm posterior stabilized rotating platform insert is placed into the tibial tray.      The wound is copiously irrigated with saline solution and the extensor mechanism closed over a hemovac drain with #1 V-loc suture. The tourniquet is released for a total tourniquet time of 47  minutes. Flexion against gravity is 130 degrees and the patella tracks normally. Subcutaneous tissue is closed with 2.0 vicryl and subcuticular with running 4.0 Monocryl. The incision is cleaned and dried and steri-strips and a bulky sterile dressing are applied. The limb is placed into a knee immobilizer and the patient is awakened and transported to recovery in stable condition.      Please note that a surgical assistant was a medical necessity for this procedure in order to perform it in a safe and expeditious manner. Surgical assistant was necessary to retract the ligaments and vital neurovascular structures to prevent injury to them and also necessary for proper positioning of the limb to allow for anatomic placement of the prosthesis.   Dione Plover Juelle Dickmann, MD    04/29/2019, 11:54 AM

## 2019-04-29 NOTE — Anesthesia Procedure Notes (Signed)
Spinal  Patient location during procedure: OR Start time: 04/29/2019 10:38 AM Staffing Anesthesiologist: Janeece Riggers, MD Performed: anesthesiologist  Preanesthetic Checklist Completed: patient identified, site marked, surgical consent, pre-op evaluation, timeout performed, IV checked, risks and benefits discussed and monitors and equipment checked Spinal Block Patient position: sitting Prep: Betadine and DuraPrep Patient monitoring: heart rate, continuous pulse ox and blood pressure Approach: midline Location: L3-4 Injection technique: single-shot Needle Needle type: Sprotte  Needle gauge: 24 G Needle length: 9 cm Assessment Sensory level: T4 Additional Notes Expiration date of kit checked and confirmed. Patient tolerated procedure well, without complications.

## 2019-04-29 NOTE — Progress Notes (Signed)
Assisted Dr. Oddono with left, ultrasound guided, adductor canal block. Side rails up, monitors on throughout procedure. See vital signs in flow sheet. Tolerated Procedure well.  

## 2019-04-29 NOTE — Interval H&P Note (Signed)
History and Physical Interval Note:  04/29/2019 8:24 AM  Krista [name]  has presented today for surgery, with the diagnosis of left knee osteoarthritis.  The various methods of treatment have been discussed with the patient and family. After consideration of risks, benefits and other options for treatment, the patient has consented to  Procedure(s) with comments: TOTAL KNEE ARTHROPLASTY (Left) - 28min as a surgical intervention.  The patient's history has been reviewed, patient examined, no change in status, stable for surgery.  I have reviewed the patient's chart and labs.  Questions were answered to the patient's satisfaction.     Pilar Plate [name]

## 2019-04-29 NOTE — Anesthesia Postprocedure Evaluation (Signed)
Anesthesia Post Note  Patient: Krista [name]  Procedure(s) Performed: TOTAL KNEE ARTHROPLASTY (Left Knee)     Patient location during evaluation: PACU Anesthesia Type: Spinal Level of consciousness: oriented and awake and alert Pain management: pain level controlled Vital Signs Assessment: post-procedure vital signs reviewed and stable Respiratory status: spontaneous breathing, respiratory function stable and patient connected to nasal cannula oxygen Cardiovascular status: blood pressure returned to baseline and stable Postop Assessment: no headache, no backache and no apparent nausea or vomiting Anesthetic complications: no    Last Vitals:  Vitals:   04/29/19 1300 04/29/19 1326  BP: 119/69 120/71  Pulse: 90 90  Resp: 16 14  Temp: 36.7 C 36.7 C  SpO2: 100% 100%    Last Pain:  Vitals:   04/29/19 1300  TempSrc:   PainSc: 0-No pain                 [place] Thresher

## 2019-04-29 NOTE — Discharge Instructions (Addendum)
° °Dr. Frank Aluisio °Total Joint Specialist °[facility] °[address] °[place], [place] [address] °[phone] ° °TOTAL KNEE REPLACEMENT POSTOPERATIVE DIRECTIONS ° °Knee Rehabilitation, Guidelines Following Surgery  °Results after knee surgery are often greatly improved when you follow the exercise, range of motion and muscle strengthening exercises prescribed by your doctor. Safety measures are also important to protect the knee from further injury. Any time any of these exercises cause you to have increased pain or swelling in your knee joint, decrease the amount until you are comfortable again and slowly increase them. If you have problems or questions, call your caregiver or physical therapist for advice.  ° °HOME CARE INSTRUCTIONS  °Remove items at home which could result in a fall. This includes throw rugs or furniture in walking pathways.  °· ICE to the affected knee every three hours for 30 minutes at a time and then as needed for pain and swelling.  Continue to use ice on the knee for pain and swelling from surgery. You may notice swelling that will progress down to the foot and ankle.  This is normal after surgery.  Elevate the leg when you are not up walking on it.   °· Continue to use the breathing machine which will help keep your temperature down.  It is common for your temperature to cycle up and down following surgery, especially at night when you are not up moving around and exerting yourself.  The breathing machine keeps your lungs expanded and your temperature down. °· Do not place pillow under knee, focus on keeping the knee straight while resting ° °DIET °You may resume your previous home diet once your are discharged from the hospital. ° °DRESSING / WOUND CARE / SHOWERING °You may shower 3 days after surgery, but keep the wounds dry during showering.  You may use an occlusive plastic wrap (Press'n Seal for example), NO SOAKING/SUBMERGING IN THE BATHTUB.  If the bandage gets  wet, change with a clean dry gauze.  If the incision gets wet, pat the wound dry with a clean towel. °You may start showering once you are discharged home but do not submerge the incision under water. Just pat the incision dry and apply a dry gauze dressing on daily. °Change the surgical dressing daily and reapply a dry dressing each time. ° °ACTIVITY °Walk with your walker as instructed. °Use walker as long as suggested by your caregivers. °Avoid periods of inactivity such as sitting longer than an hour when not asleep. This helps prevent blood clots.  °You may resume a sexual relationship in one month or when given the OK by your doctor.  °You may return to work once you are cleared by your doctor.  °Do not drive a car for 6 weeks or until released by you surgeon.  °Do not drive while taking narcotics. ° °WEIGHT BEARING °Weight bearing as tolerated with assist device (walker, cane, etc) as directed, use it as long as suggested by your surgeon or therapist, typically at least 4-6 weeks. ° °POSTOPERATIVE CONSTIPATION PROTOCOL °Constipation - defined medically as fewer than three stools per week and severe constipation as less than one stool per week. ° °One of the most common issues patients have following surgery is constipation.  Even if you have a regular bowel pattern at home, your normal regimen is likely to be disrupted due to multiple reasons following surgery.  Combination of anesthesia, postoperative narcotics, change in appetite and fluid intake all can affect your bowels.    In order to avoid complications following surgery, here are some recommendations in order to help you during your recovery period. ° °Colace (docusate) - Pick up an over-the-counter form of Colace or another stool softener and take twice a day as long as you are requiring postoperative pain medications.  Take with a full glass of water daily.  If you experience loose stools or diarrhea, hold the colace until you stool forms back up.  If  your symptoms do not get better within 1 week or if they get worse, check with your doctor. ° °Dulcolax (bisacodyl) - Pick up over-the-counter and take as directed by the product packaging as needed to assist with the movement of your bowels.  Take with a full glass of water.  Use this product as needed if not relieved by Colace only.  ° °MiraLax (polyethylene glycol) - Pick up over-the-counter to have on hand.  MiraLax is a solution that will increase the amount of water in your bowels to assist with bowel movements.  Take as directed and can mix with a glass of water, juice, soda, coffee, or tea.  Take if you go more than two days without a movement. °Do not use MiraLax more than once per day. Call your doctor if you are still constipated or irregular after using this medication for 7 days in a row. ° °If you continue to have problems with postoperative constipation, please contact the office for further assistance and recommendations.  If you experience "the worst abdominal pain ever" or develop nausea or vomiting, please contact the office immediatly for further recommendations for treatment. ° °ITCHING ° If you experience itching with your medications, try taking only a single pain pill, or even half a pain pill at a time.  You can also use Benadryl over the counter for itching or also to help with sleep.  ° °TED HOSE STOCKINGS °Wear the elastic stockings on both legs for three weeks following surgery during the day but you may remove then at night for sleeping. ° °MEDICATIONS °See your medication summary on the “After Visit Summary” that the nursing staff will review with you prior to discharge.  You may have some home medications which will be placed on hold until you complete the course of blood thinner medication.  It is important for you to complete the blood thinner medication as prescribed by your surgeon.  Continue your approved medications as instructed at time of discharge. ° °PRECAUTIONS °If you  experience chest pain or shortness of breath - call 911 immediately for transfer to the hospital emergency department.  °If you develop a fever greater that 101 F, purulent drainage from wound, increased redness or drainage from wound, foul odor from the wound/dressing, or calf pain - CONTACT YOUR SURGEON.   °                                                °FOLLOW-UP APPOINTMENTS °Make sure you keep all of your appointments after your operation with your surgeon and caregivers. You should call the office at the above phone number and make an appointment for approximately two weeks after the date of your surgery or on the date instructed by your surgeon outlined in the "After Visit Summary". ° ° °RANGE OF MOTION AND STRENGTHENING EXERCISES  °Rehabilitation of the knee is important following a knee injury or   an operation. After just a few days of immobilization, the muscles of the thigh which control the knee become weakened and shrink (atrophy). Knee exercises are designed to build up the tone and strength of the thigh muscles and to improve knee motion. Often times heat used for twenty to thirty minutes before working out will loosen up your tissues and help with improving the range of motion but do not use heat for the first two weeks following surgery. These exercises can be done on a training (exercise) mat, on the floor, on a table or on a bed. Use what ever works the best and is most comfortable for you Knee exercises include:  °Leg Lifts - While your knee is still immobilized in a splint or cast, you can do straight leg raises. Lift the leg to 60 degrees, hold for 3 sec, and slowly lower the leg. Repeat 10-20 times 2-3 times daily. Perform this exercise against resistance later as your knee gets better.  °Quad and Hamstring Sets - Tighten up the muscle on the front of the thigh (Quad) and hold for 5-10 sec. Repeat this 10-20 times hourly. Hamstring sets are done by pushing the foot backward against an object  and holding for 5-10 sec. Repeat as with quad sets.  °· Leg Slides: Lying on your back, slowly slide your foot toward your buttocks, bending your knee up off the floor (only go as far as is comfortable). Then slowly slide your foot back down until your leg is flat on the floor again. °· Angel Wings: Lying on your back spread your legs to the side as far apart as you can without causing discomfort.  °A rehabilitation program following serious knee injuries can speed recovery and prevent re-injury in the future due to weakened muscles. Contact your doctor or a physical therapist for more information on knee rehabilitation.  ° °IF YOU ARE TRANSFERRED TO A SKILLED REHAB FACILITY °If the patient is transferred to a skilled rehab facility following release from the hospital, a list of the current medications will be sent to the facility for the patient to continue.  When discharged from the skilled rehab facility, please have the facility set up the patient's Home Health Physical Therapy prior to being released. Also, the skilled facility will be responsible for providing the patient with their medications at time of release from the facility to include their pain medication, the muscle relaxants, and their blood thinner medication. If the patient is still at the rehab facility at time of the two week follow up appointment, the skilled rehab facility will also need to assist the patient in arranging follow up appointment in our office and any transportation needs. ° °MAKE SURE YOU:  °Understand these instructions.  °Get help right away if you are not doing well or get worse.  ° ° °Pick up stool softner and laxative for home use following surgery while on pain medications. °Do not submerge incision under water. °Please use good hand washing techniques while changing dressing each day. °May shower starting three days after surgery. °Please use a clean towel to pat the incision dry following showers. °Continue to use ice for  pain and swelling after surgery. °Do not use any lotions or creams on the incision until instructed by your surgeon. ° °Information on my medicine - XARELTO® (Rivaroxaban) ° ° ° °Why was Xarelto® prescribed for you? °Xarelto® was prescribed for you to reduce the risk of blood clots forming after orthopedic surgery. The medical term for   these abnormal blood clots is venous thromboembolism (VTE). ° °What do you need to know about xarelto® ? °Take your Xarelto® ONCE DAILY at the same time every day. °You may take it either with or without food. ° °If you have difficulty swallowing the tablet whole, you may crush it and mix in applesauce just prior to taking your dose. ° °Take Xarelto® exactly as prescribed by your doctor and DO NOT stop taking Xarelto® without talking to the doctor who prescribed the medication.  Stopping without other VTE prevention medication to take the place of Xarelto® may increase your risk of developing a clot. ° °After discharge, you should have regular check-up appointments with your healthcare provider that is prescribing your Xarelto®.   ° °What do you do if you miss a dose? °If you miss a dose, take it as soon as you remember on the same day then continue your regularly scheduled once daily regimen the next day. Do not take two doses of Xarelto® on the same day.  ° °Important Safety Information °A possible side effect of Xarelto® is bleeding. You should call your healthcare provider right away if you experience any of the following: °? Bleeding from an injury or your nose that does not stop. °? Unusual colored urine (red or dark brown) or unusual colored stools (red or black). °? Unusual bruising for unknown reasons. °? A serious fall or if you hit your head (even if there is no bleeding). ° °Some medicines may interact with Xarelto® and might increase your risk of bleeding while on Xarelto®. To help avoid this, consult your healthcare provider or pharmacist prior to using any new  prescription or non-prescription medications, including herbals, vitamins, non-steroidal anti-inflammatory drugs (NSAIDs) and supplements. ° °This website has more information on Xarelto®: www.xarelto.com. ° ° °

## 2019-04-29 NOTE — Anesthesia Procedure Notes (Signed)
Anesthesia Regional Block: Adductor canal block   Pre-Anesthetic Checklist: ,, timeout performed, Correct Patient, Correct Site, Correct Laterality, Correct Procedure, Correct Position, site marked, Risks and benefits discussed,  Surgical consent,  Pre-op evaluation,  At surgeon's request and post-op pain management  Laterality: Right  Prep: chloraprep       Needles:  Injection technique: Single-shot  Needle Type: Echogenic Stimulator Needle     Needle Length: 5cm  Needle Gauge: 22     Additional Needles:   Procedures:, nerve stimulator,,, ultrasound used (permanent image in chart),,,,  Narrative:  Start time: 04/29/2019 10:10 AM End time: 04/29/2019 10:15 AM Injection made incrementally with aspirations every 5 mL.  Performed by: Personally  Anesthesiologist: Janeece Riggers, MD  Additional Notes: Functioning IV was confirmed and monitors were applied.  A 53mm 22ga Arrow echogenic stimulator needle was used. Sterile prep and drape,hand hygiene and sterile gloves were used. Ultrasound guidance: relevant anatomy identified, needle position confirmed, local anesthetic spread visualized around nerve(s)., vascular puncture avoided.  Image printed for medical record. Negative aspiration and negative test dose prior to incremental administration of local anesthetic. The patient tolerated the procedure well.

## 2019-04-29 NOTE — Evaluation (Signed)
Physical Therapy Evaluation Patient Details Name: Krista [name] MRN: [id number] DOB: 13-Aug-1956 Today's Date: 04/29/2019   History of Present Illness  s/p L TKA PMH: HTN  Clinical Impression  Pt is s/p TKA resulting in the deficits listed below (see PT Problem List).  amb ~50' with min assist. Anticipate steady progress in acute setting  Pt will benefit from skilled PT to increase their independence and safety with mobility to allow discharge to the venue listed below.      Follow Up Recommendations Follow surgeon's recommendation for DC plan and follow-up therapies    Equipment Recommendations  Rolling walker with 5" wheels;3in1 (PT)    Recommendations for Other Services       Precautions / Restrictions Precautions Precautions: Knee;Fall Required Braces or Orthoses: Knee Immobilizer - Left Knee Immobilizer - Left: Discontinue once straight leg raise with < 10 degree lag Restrictions Weight Bearing Restrictions: No      Mobility  Bed Mobility Overal bed mobility: Needs Assistance Bed Mobility: Supine to Sit     Supine to sit: Min assist     General bed mobility comments: assist with  LLE  Transfers Overall transfer level: Needs assistance Equipment used: Rolling walker (2 wheeled) Transfers: Sit to/from Stand Sit to Stand: Min assist         General transfer comment: cues for hand placement and LLE position. assist to transition to walker  Ambulation/Gait Ambulation/Gait assistance: Min guard;Min assist Gait Distance (Feet): 50 Feet Assistive device: Rolling walker (2 wheeled) Gait Pattern/deviations: Step-to pattern;Decreased weight shift to left     General Gait Details: cues for sequence and RW position  Stairs            Wheelchair Mobility    Modified Rankin (Stroke Patients Only)       Balance                                             Pertinent Vitals/Pain Pain Assessment: 0-10 Pain Score: 4  Pain  Location: left knee Pain Descriptors / Indicators: Grimacing;Discomfort Pain Intervention(s): Limited activity within patient's tolerance;Monitored during session;Premedicated before session;Repositioned    Home Living Family/patient expects to be discharged to:: Private residence Living Arrangements: Spouse/significant other Available Help at Discharge: Family Type of Home: House Home Access: Stairs to enter   Technical brewer of Steps: 1 small step Home Layout: One level Home Equipment: Other (comment);Cane - single point Additional Comments: lift chair    Prior Function Level of Independence: Independent         Comments: amb with cane     Hand Dominance        Extremity/Trunk Assessment   Upper Extremity Assessment Upper Extremity Assessment: Overall WFL for tasks assessed    Lower Extremity Assessment Lower Extremity Assessment: LLE deficits/detail LLE Deficits / Details: ankle WFL; knee and hip 2+/5, AAROM grossly 7 to 60 degrees knee flexion       Communication      Cognition Arousal/Alertness: Awake/alert Behavior During Therapy: WFL for tasks assessed/performed Overall Cognitive Status: Within Functional Limits for tasks assessed                                        General Comments      Exercises Total Joint Exercises Ankle Circles/Pumps:  AROM;Both;10 reps Quad Sets: AROM;Both;5 reps   Assessment/Plan    PT Assessment Patient needs continued PT services  PT Problem List Decreased strength;Decreased range of motion;Decreased activity tolerance;Pain;Decreased mobility;Decreased knowledge of use of DME;Decreased knowledge of precautions       PT Treatment Interventions Gait training;DME instruction;Therapeutic exercise;Functional mobility training;Therapeutic activities;Patient/family education    PT Goals (Current goals can be found in the Care Plan section)  Acute Rehab PT Goals PT Goal Formulation: With  patient Time For Goal Achievement: 05/06/19 Potential to Achieve Goals: Good    Frequency 7X/week   Barriers to discharge        Co-evaluation               AM-PAC PT "6 Clicks" Mobility  Outcome Measure Help needed turning from your back to your side while in a flat bed without using bedrails?: A Little Help needed moving from lying on your back to sitting on the side of a flat bed without using bedrails?: A Little Help needed moving to and from a bed to a chair (including a wheelchair)?: A Little Help needed standing up from a chair using your arms (e.g., wheelchair or bedside chair)?: A Little Help needed to walk in hospital room?: A Little Help needed climbing 3-5 steps with a railing? : A Lot 6 Click Score: 17    End of Session Equipment Utilized During Treatment: Gait belt;Left knee immobilizer Activity Tolerance: Patient tolerated treatment well Patient left: in chair;with call bell/phone within reach;with chair alarm set   PT Visit Diagnosis: Difficulty in walking, not elsewhere classified (R26.2)    Time: 1610-9604 PT Time Calculation (min) (ACUTE ONLY): 21 min   Charges:   PT Evaluation $PT Eval Low Complexity: 1 Low          Kenyon Ana, PT  Pager: [phone] Acute Rehab Dept [facility]): [phone]   04/29/2019   [facility] 04/29/2019, 5:09 PM

## 2019-04-30 ENCOUNTER — Encounter (HOSPITAL_COMMUNITY): Payer: Self-pay | Admitting: Orthopedic Surgery

## 2019-04-30 DIAGNOSIS — M1712 Unilateral primary osteoarthritis, left knee: Secondary | ICD-10-CM | POA: Diagnosis not present

## 2019-04-30 LAB — CBC
HCT: 34.5 % — ABNORMAL LOW (ref 36.0–46.0)
Hemoglobin: 10.3 g/dL — ABNORMAL LOW (ref 12.0–15.0)
MCH: 24.1 pg — ABNORMAL LOW (ref 26.0–34.0)
MCHC: 29.9 g/dL — ABNORMAL LOW (ref 30.0–36.0)
MCV: 80.8 fL (ref 80.0–100.0)
Platelets: 379 10*3/uL (ref 150–400)
RBC: 4.27 MIL/uL (ref 3.87–5.11)
RDW: 13.5 % (ref 11.5–15.5)
WBC: 13 10*3/uL — ABNORMAL HIGH (ref 4.0–10.5)
nRBC: 0 % (ref 0.0–0.2)

## 2019-04-30 LAB — BASIC METABOLIC PANEL
Anion gap: 10 (ref 5–15)
BUN: 11 mg/dL (ref 8–23)
CO2: 25 mmol/L (ref 22–32)
Calcium: 8.2 mg/dL — ABNORMAL LOW (ref 8.9–10.3)
Chloride: 97 mmol/L — ABNORMAL LOW (ref 98–111)
Creatinine, Ser: 0.93 mg/dL (ref 0.44–1.00)
GFR calc Af Amer: >60 mL/min (ref >60)
GFR calc non Af Amer: >60 mL/min (ref >60)
Glucose, Bld: 169 mg/dL — ABNORMAL HIGH (ref 70–99)
Potassium: 4 mmol/L (ref 3.5–5.1)
Sodium: 132 mmol/L — ABNORMAL LOW (ref 135–145)

## 2019-04-30 LAB — GLUCOSE, CAPILLARY
Glucose-Capillary: 156 mg/dL — ABNORMAL HIGH (ref 70–99)
Glucose-Capillary: 159 mg/dL — ABNORMAL HIGH (ref 70–99)

## 2019-04-30 MED ORDER — RIVAROXABAN 10 MG PO TABS: 10.0000 mg | ORAL_TABLET | Freq: Every day | ORAL | 0 refills | Status: AC

## 2019-04-30 MED ORDER — METHOCARBAMOL 500 MG PO TABS: 500.0000 mg | ORAL_TABLET | Freq: Four times a day (QID) | ORAL | 0 refills | Status: AC | PRN

## 2019-04-30 MED ORDER — OXYCODONE HCL 5 MG PO TABS: 5.0000 mg | ORAL_TABLET | Freq: Four times a day (QID) | ORAL | 0 refills | Status: AC | PRN

## 2019-04-30 NOTE — Progress Notes (Signed)
   Subjective: 1 Day Post-Op Procedure(s) (LRB): TOTAL KNEE ARTHROPLASTY (Left) Patient reports pain as mild.   Patient seen in rounds by Dr. Wynelle Link. Patient is well, and has had no acute complaints or problems. No issues overnight, foley catheter removed this AM. Denies chest pain, SOB, or calf pain.  Did well ambulating with physical therapy yesterday, will continue with two more sessions today.  Objective: Vital signs in last 24 hours: Temp:  [97.5 F (36.4 C)-99.6 F (37.6 C)] 99.1 F (37.3 C) (08/11 0537) Pulse Rate:  [88-105] 105 (08/11 0537) Resp:  [14-24] 16 (08/11 0537) BP: (119-177)/(69-91) 149/80 (08/11 0537) SpO2:  [93 %-100 %] 97 % (08/11 0537) Weight:  [99.3 kg] 99.3 kg (08/10 0758)  Intake/Output from previous day:  Intake/Output Summary (Last 24 hours) at 04/30/2019 0707 Last data filed at 04/30/2019 0615 Gross per 24 hour  Intake 3977.41 ml  Output 2725 ml  Net 1252.41 ml    Labs: Recent Labs    04/30/19 0232  HGB 10.3*   Recent Labs    04/30/19 0232  WBC 13.0*  RBC 4.27  HCT 34.5*  PLT 379   Recent Labs    04/30/19 0232  NA 132*  K 4.0  CL 97*  CO2 25  BUN 11  CREATININE 0.93  GLUCOSE 169*  CALCIUM 8.2*   Exam: General - Patient is Alert and Oriented Extremity - Neurologically intact Neurovascular intact Sensation intact distally Dorsiflexion/Plantar flexion intact Dressing - dressing C/D/I Motor Function - intact, moving foot and toes well on exam.   Past Medical History:  Diagnosis Date  . Arthritis   . 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 Constipation   . Depression   . Dyspnea   . Eczema   . GERD (gastroesophageal reflux disease)   . Gross hematuria   . Heart murmur   . Hypertension   . Type 2 diabetes mellitus (HCC)     Assessment/Plan: 1 Day Post-Op Procedure(s) (LRB): TOTAL KNEE ARTHROPLASTY (Left) Principal Problem:   OA (osteoarthritis) of knee Active Problems:   Osteoarthritis of left knee   Estimated body mass index is 35.35 kg/m as calculated from the following:   Height as of this encounter: 5\' 6"  (1.676 m).   Weight as of this encounter: 99.3 kg. Advance diet Up with therapy D/C IV fluids  Anticipated LOS equal to or greater than 2 midnights due to - Age 62 and older with one or more of the following:  - Obesity  - Expected need for hospital services (PT, OT, Nursing) required for safe  discharge  - Anticipated need for postoperative skilled nursing care or inpatient rehab  - Active co-morbidities: Diabetes OR   - Unanticipated findings during/Post Surgery: None  - Patient is a high risk of re-admission due to: None    DVT Prophylaxis - Xarelto Weight bearing as tolerated. D/C O2 and pulse ox and try on room air. Hemovac pulled without difficulty, will continue therapy today.  Plan is to go Home after hospital stay. Plan for discharge today after two sessions of therapy. Scheduled for outpatient PT at [facility] in [place] on Friday. Follow-up in the office in 2 weeks.   Theresa Duty, PA-C Orthopedic Surgery 04/30/2019, 7:07 AM

## 2019-04-30 NOTE — Plan of Care (Signed)
Plan of care reviewed and discussed with the patient. 

## 2019-04-30 NOTE — Plan of Care (Signed)
Ready for DC home

## 2019-04-30 NOTE — Progress Notes (Signed)
Physical Therapy Treatment Patient Details Name: Krista [name] MRN: [id number] DOB: 1956-06-18 Today's Date: 04/30/2019    History of Present Illness s/p L TKA PMH: HTN    PT Comments    Pt progressing, pt was sleeping and initially difficult to arouse. Will see for  Second session this pm, should be ready for d/c at that time.   Follow Up Recommendations  Follow surgeon's recommendation for DC plan and follow-up therapies     Equipment Recommendations  Rolling walker with 5" wheels;3in1 (PT)    Recommendations for Other Services       Precautions / Restrictions Precautions Precautions: Knee;Fall Precaution Comments: IND SLRs  Required Braces or Orthoses: Knee Immobilizer - Left Knee Immobilizer - Left: Discontinue once straight leg raise with < 10 degree lag Restrictions Weight Bearing Restrictions: No    Mobility  Bed Mobility         Supine to sit: Supervision;Min guard     General bed mobility comments: incr time, supervision to min/guard for safety and to lower LLE to floor  Transfers Overall transfer level: Needs assistance Equipment used: Rolling walker (2 wheeled) Transfers: Sit to/from Stand Sit to Stand: Min guard;Supervision         General transfer comment: cues for hand placement and LLE position.   Ambulation/Gait Ambulation/Gait assistance: Min Gaffer (Feet): 65 Feet Assistive device: Rolling walker (2 wheeled) Gait Pattern/deviations: Step-to pattern;Decreased weight shift to left Gait velocity: decr   General Gait Details: cues for sequence and RW position   Stairs             Wheelchair Mobility    Modified Rankin (Stroke Patients Only)       Balance                                            Cognition Arousal/Alertness: Awake/alert Behavior During Therapy: WFL for tasks assessed/performed Overall Cognitive Status: Within Functional Limits for tasks assessed                                         Exercises Total Joint Exercises Ankle Circles/Pumps: AROM;Both;10 reps    General Comments        Pertinent Vitals/Pain Pain Assessment: 0-10 Pain Score: 4  Pain Location: left knee Pain Descriptors / Indicators: Grimacing;Discomfort Pain Intervention(s): Limited activity within patient's tolerance;Monitored during session;Premedicated before session;Repositioned    Home Living                      Prior Function            PT Goals (current goals can now be found in the care plan section) Acute Rehab PT Goals PT Goal Formulation: With patient Time For Goal Achievement: 05/06/19 Potential to Achieve Goals: Good Progress towards PT goals: Progressing toward goals    Frequency    7X/week      PT Plan Current plan remains appropriate    Co-evaluation              AM-PAC PT "6 Clicks" Mobility   Outcome Measure  Help needed turning from your back to your side while in a flat bed without using bedrails?: A Little Help needed moving from lying on your back to sitting on the  side of a flat bed without using bedrails?: None Help needed moving to and from a bed to a chair (including a wheelchair)?: A Little Help needed standing up from a chair using your arms (e.g., wheelchair or bedside chair)?: A Little Help needed to walk in hospital room?: A Little Help needed climbing 3-5 steps with a railing? : A Little 6 Click Score: 19    End of Session Equipment Utilized During Treatment: Gait belt Activity Tolerance: Patient tolerated treatment well Patient left: in chair;with call bell/phone within reach;with chair alarm set   PT Visit Diagnosis: Difficulty in walking, not elsewhere classified (R26.2)     Time: 1749-4496 PT Time Calculation (min) (ACUTE ONLY): 15 min  Charges:  $Gait Training: 8-22 mins                     Kenyon Ana, PT  Pager: [phone] Acute Rehab Dept [facility]):  [phone]   04/30/2019    [facility] 04/30/2019, 12:17 PM

## 2019-04-30 NOTE — TOC Transition Note (Signed)
Transition of Care [facility]) - CM/SW Discharge Note   Patient Details  Name: Krista [name] MRN: [id number] Date of Birth: 12-06-1955  [facility]) CM/SW Contact:  Leeroy Cha, RN Phone Number: 04/30/2019, 4:03 PM   Clinical Narrative:    dcd to home with dme   Final next level of care: OP Rehab Barriers to Discharge: No Barriers Identified   Patient Goals and CMS Choice Patient states their goals for this hospitalization and ongoing recovery are:: to go home and do the op therapy CMS Medicare.gov Compare Post Acute Care list provided to:: Patient Choice offered to / list presented to : Patient  Discharge Placement                       Discharge Plan and Services   Discharge Planning Services: CM Consult Post Acute Care Choice: Durable Medical Equipment          DME Arranged: Gilford Rile rolling, 3-N-1 DME Agency: [employer] Date DME Agency Contacted: 04/30/19 Time DME Agency Contacted: 0037 Representative spoke with at DME Agency: [email or web address]            Social Determinants of Health ([place]) Interventions     Readmission Risk Interventions No flowsheet data found.

## 2019-04-30 NOTE — Progress Notes (Signed)
   04/30/19 1513  PT Visit Information  Last PT Received On 04/30/19 pt progressing well, exercise focused session, ready for d/c from PT standpoint.   Assistance Needed +1  History of Present Illness s/p L TKA PMH: HTN  Precautions  Precautions Knee;Fall  Precaution Comments IND SLRs   Knee Immobilizer - Left Discontinue once straight leg raise with < 10 degree lag  Restrictions  Weight Bearing Restrictions No  Other Position/Activity Restrictions WBAT  Pain Assessment  Pain Assessment 0-10  Pain Score 3  Pain Location left knee  Pain Descriptors / Indicators Grimacing;Discomfort  Pain Intervention(s) Limited activity within patient's tolerance;Monitored during session;Premedicated before session;Ice applied  Cognition  Arousal/Alertness Awake/alert  Behavior During Therapy WFL for tasks assessed/performed  Overall Cognitive Status Within Functional Limits for tasks assessed  Transfers  Equipment used Rolling walker (2 wheeled)  Transfers Sit to/from Stand  Sit to Stand Supervision  General transfer comment cues for hand placement and LLE position.   Ambulation/Gait  Ambulation/Gait assistance Supervision  Gait Distance (Feet) 15 Feet  Assistive device Rolling walker (2 wheeled)  Gait Pattern/deviations Step-to pattern;Decreased weight shift to left  General Gait Details cues for sequence and RW position  Gait velocity decr  Total Joint Exercises  Ankle Circles/Pumps AROM;Both;10 reps  Quad Sets AROM;Both;10 reps  Short Arc Quad AROM;AAROM;Strengthening;Left;10 reps  Heel Slides AAROM;Left;10 reps;AROM  Hip ABduction/ADduction AROM;AAROM;Left;10 reps  Straight Leg Raises AROM;AAROM;Left;10 reps  Long Arc Quad AROM;Strengthening;Left;5 reps  Goniometric ROM ~ 8 to 75 degrees L knee flexion  PT - End of Session  Equipment Utilized During Treatment Gait belt  Activity Tolerance Patient tolerated treatment well  Patient left in chair;with call bell/phone within reach;with  family/visitor present   PT - Assessment/Plan  PT Plan Current plan remains appropriate  PT Visit Diagnosis Difficulty in walking, not elsewhere classified (R26.2)  PT Frequency (ACUTE ONLY) 7X/week  Follow Up Recommendations Follow surgeon's recommendation for DC plan and follow-up therapies  PT equipment Rolling walker with 5" wheels;3in1 (PT)  AM-PAC PT "6 Clicks" Mobility Outcome Measure (Version 2)  Help needed turning from your back to your side while in a flat bed without using bedrails? 3  Help needed moving from lying on your back to sitting on the side of a flat bed without using bedrails? 4  Help needed moving to and from a bed to a chair (including a wheelchair)? 3  Help needed standing up from a chair using your arms (e.g., wheelchair or bedside chair)? 3  Help needed to walk in hospital room? 3  Help needed climbing 3-5 steps with a railing?  3  6 Click Score 19  Consider Recommendation of Discharge To: Home with [facility]  PT Goal Progression  Progress towards PT goals Progressing toward goals  Acute Rehab PT Goals  PT Goal Formulation With patient  Time For Goal Achievement 05/06/19  Potential to Achieve Goals Good  PT Time Calculation  PT Start Time (ACUTE ONLY) 1447  PT Stop Time (ACUTE ONLY) 1510  PT Time Calculation (min) (ACUTE ONLY) 23 min  PT General Charges  $$ ACUTE PT VISIT 1 Visit  PT Treatments  $Therapeutic Exercise 23-37 mins

## 2019-05-01 NOTE — Discharge Summary (Signed)
Physician Discharge Summary   Patient ID: CADIENCE BRADFIELD MRN: [id number] DOB/AGE: 1956-06-11 63 y.o.  Admit date: 04/29/2019 Discharge date: 04/30/2019  Primary Diagnosis: Osteoarthritis, left knee   Admission Diagnoses:  Past Medical History:  Diagnosis Date  . Arthritis   . 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 Constipation   . Depression   . Dyspnea   . Eczema   . GERD (gastroesophageal reflux disease)   . Gross hematuria   . Heart murmur   . Hypertension   . Type 2 diabetes mellitus ([place])    Discharge Diagnoses:   Principal Problem:   OA (osteoarthritis) of knee Active Problems:   Osteoarthritis of left knee  Estimated body mass index is 35.35 kg/m as calculated from the following:   Height as of this encounter: 5\' 6"  (1.676 m).   Weight as of this encounter: 99.3 kg.  Procedure:  Procedure(s) (LRB): TOTAL KNEE ARTHROPLASTY (Left)   Consults: None  HPI: Krista [name] is a 63 y.o. year old female with end stage OA of her left knee with progressively worsening pain and dysfunction. She has constant pain, with activity and at rest and significant functional deficits with difficulties even with ADLs. She has had extensive non-op management including analgesics, injections of cortisone and viscosupplements, and home exercise program, but remains in significant pain with significant dysfunction. Radiographs show bone on bone arthritis medial and patellofemoral. She presents now for left Total Knee Arthroplasty.   Laboratory Data: Admission on 04/29/2019, Discharged on 04/30/2019  Component Date Value Ref Range Status  . Glucose-Capillary 04/29/2019 198* 70 - 99 mg/dL Final  . Comment 1 04/29/2019 Notify RN   Final  . Comment 2 04/29/2019 Document in Chart   Final  . Glucose-Capillary 04/29/2019 87  70 - 99 mg/dL Final  . Glucose-Capillary 04/29/2019 101* 70 - 99 mg/dL Final  . WBC 04/30/2019 13.0* 4.0 - 10.5 K/uL Final  . RBC 04/30/2019 4.27  3.87 - 5.11  MIL/uL Final  . Hemoglobin 04/30/2019 10.3* 12.0 - 15.0 g/dL Final  . HCT 04/30/2019 34.5* 36.0 - 46.0 % Final  . MCV 04/30/2019 80.8  80.0 - 100.0 fL Final  . MCH 04/30/2019 24.1* 26.0 - 34.0 pg Final  . MCHC 04/30/2019 29.9* 30.0 - 36.0 g/dL Final  . RDW 04/30/2019 13.5  11.5 - 15.5 % Final  . Platelets 04/30/2019 379  150 - 400 K/uL Final  . nRBC 04/30/2019 0.0  0.0 - 0.2 % Final   Performed at [facility], [place] [address] [place], [place] [address]  . Sodium 04/30/2019 132* 135 - 145 mmol/L Final  . Potassium 04/30/2019 4.0  3.5 - 5.1 mmol/L Final  . Chloride 04/30/2019 97* 98 - 111 mmol/L Final  . CO2 04/30/2019 25  22 - 32 mmol/L Final  . Glucose, Bld 04/30/2019 169* 70 - 99 mg/dL Final  . BUN 04/30/2019 11  8 - 23 mg/dL Final  . Creatinine, Ser 04/30/2019 0.93  0.44 - 1.00 mg/dL Final  . Calcium 04/30/2019 8.2* 8.9 - 10.3 mg/dL Final  . GFR calc non Af Amer 04/30/2019 >60  >60 mL/min Final  . GFR calc Af Amer 04/30/2019 >60  >60 mL/min Final  . Anion gap 04/30/2019 10  5 - 15 Final   Performed at [facility], [place] [address] [place], [place] [address]  . Glucose-Capillary 04/29/2019 156* 70 - 99 mg/dL Final  . Glucose-Capillary 04/30/2019 156* 70 - 99 mg/dL Final  .  Glucose-Capillary 04/30/2019 159* 70 - 99 mg/dL Final  Hospital Outpatient Visit on 04/25/2019  Component Date Value Ref Range Status  . SARS Coronavirus 2 04/25/2019 NEGATIVE  NEGATIVE Final   Comment: (NOTE) SARS-CoV-2 target nucleic acids are NOT DETECTED. The SARS-CoV-2 RNA is generally detectable in upper and lower respiratory specimens during the acute phase of infection. Negative results do not preclude SARS-CoV-2 infection, do not rule out co-infections with other pathogens, and should not be used as the sole basis for treatment or other patient management decisions. Negative results must be combined with clinical observations, patient history, and epidemiological  information. The expected result is Negative. Fact Sheet for Patients: SugarRoll.be Fact Sheet for Healthcare Providers: https://www.woods-mathews.com/ This test is not yet approved or cleared by the [place] FDA and  has been authorized for detection and/or diagnosis of SARS-CoV-2 by FDA under an Emergency Use Authorization (EUA). This EUA will remain  in effect (meaning this test can be used) for the duration of the COVID-19 declaration under Section 56                          4(b)(1) of the Act, 21 U.S.C. section 360bbb-3(b)(1), unless the authorization is terminated or revoked sooner. Performed at [facility], [place] [address], [place], [place] [address]   Hospital Outpatient Visit on 04/24/2019  Component Date Value Ref Range Status  . Glucose-Capillary 04/24/2019 91  70 - 99 mg/dL Final  . MRSA, PCR 04/24/2019 POSITIVE* NEGATIVE Final   Comment: RESULT CALLED TO, READ BACK BY AND VERIFIED WITH: AFTERHOURS   . Staphylococcus aureus 04/24/2019 POSITIVE* NEGATIVE Final   Comment: (NOTE) The Xpert SA Assay (FDA approved for NASAL specimens in patients 26 years of age and older), is one component of a comprehensive surveillance program. It is not intended to diagnose infection nor to guide or monitor treatment. Performed at [facility], [place] [address] [place], [place] [address]   . aPTT 04/24/2019 31  24 - 36 seconds Final   Performed at [facility], [place] [address], [place], [place] [address]  . WBC 04/24/2019 8.4  4.0 - 10.5 K/uL Final  . RBC 04/24/2019 4.70  3.87 - 5.11 MIL/uL Final  . Hemoglobin 04/24/2019 11.5* 12.0 - 15.0 g/dL Final  . HCT 04/24/2019 38.3  36.0 - 46.0 % Final  . MCV 04/24/2019 81.5  80.0 - 100.0 fL Final  . MCH 04/24/2019 24.5* 26.0 - 34.0 pg Final  . MCHC 04/24/2019 30.0  30.0 - 36.0 g/dL Final  . RDW 04/24/2019 13.7  11.5 - 15.5 % Final  . Platelets  04/24/2019 416* 150 - 400 K/uL Final  . nRBC 04/24/2019 0.0  0.0 - 0.2 % Final   Performed at [facility], [place] [address] [place], [place] [address]  . Sodium 04/24/2019 139  135 - 145 mmol/L Final  . Potassium 04/24/2019 4.4  3.5 - 5.1 mmol/L Final  . Chloride 04/24/2019 101  98 - 111 mmol/L Final  . CO2 04/24/2019 29  22 - 32 mmol/L Final  . Glucose, Bld 04/24/2019 88  70 - 99 mg/dL Final  . BUN 04/24/2019 13  8 - 23 mg/dL Final  . Creatinine, Ser 04/24/2019 0.95  0.44 - 1.00 mg/dL Final  . Calcium 04/24/2019 9.5  8.9 - 10.3 mg/dL Final  . Total Protein 04/24/2019 8.2* 6.5 - 8.1 g/dL Final  . Albumin 04/24/2019 3.8  3.5 - 5.0  g/dL Final  . AST 04/24/2019 18  15 - 41 U/L Final  . ALT 04/24/2019 19  0 - 44 U/L Final  . Alkaline Phosphatase 04/24/2019 104  38 - 126 U/L Final  . Total Bilirubin 04/24/2019 0.5  0.3 - 1.2 mg/dL Final  . GFR calc non Af Amer 04/24/2019 >60  >60 mL/min Final  . GFR calc Af Amer 04/24/2019 >60  >60 mL/min Final  . Anion gap 04/24/2019 9  5 - 15 Final   Performed at [facility], [place] [address], [place], [place] [address]  . Prothrombin Time 04/24/2019 13.0  11.4 - 15.2 seconds Final  . INR 04/24/2019 1.0  0.8 - 1.2 Final   Comment: (NOTE) INR goal varies based on device and disease states. Performed at [facility], [place] [address] [place], [place] [address]   . ABO/RH(D) 04/24/2019 O POS   Final  . Antibody Screen 04/24/2019 NEG   Final  . Sample Expiration 04/24/2019 05/02/2019,2359   Final  . Extend sample reason 04/24/2019    Final                   Value:NO TRANSFUSIONS OR PREGNANCY IN THE PAST 3 MONTHS Performed at [facility], [place] [address] [place], [place] [address]   . Hgb A1c MFr Bld 04/24/2019 6.4* 4.8 - 5.6 % Final   Comment: (NOTE) Pre diabetes:          5.7%-6.4% Diabetes:              >6.4% Glycemic control for   <7.0% adults with diabetes   . Mean  Plasma Glucose 04/24/2019 136.98  mg/dL Final   Performed at [facility] [address] [place], [place] [address]  . ABO/RH(D) 04/24/2019    Final                   Value:O POS Performed at [facility], [place] [address], [place], [place] [address]      X-Rays:No results found.  EKG: Orders placed or performed in visit on 04/24/19  . EKG 12-Lead  . EKG 12-Lead  . EKG 12-Lead     Hospital Course: Krista [name] is a 63 y.o. who was admitted to [facility]. They were brought to the operating room on 04/29/2019 and underwent Procedure(s): TOTAL KNEE ARTHROPLASTY.  Patient tolerated the procedure well and was later transferred to the recovery room and then to the orthopaedic floor for postoperative care. They were given PO and IV analgesics for pain control following their surgery. They were given 24 hours of postoperative antibiotics of  Anti-infectives (From admission, onward)   Start     Dose/Rate Route Frequency Ordered Stop   04/29/19 1700  ceFAZolin (ANCEF) IVPB 2g/100 mL premix     2 g 200 mL/hr over 30 Minutes Intravenous Every 6 hours 04/29/19 1350 04/29/19 2323   04/29/19 0815  vancomycin (VANCOCIN) IVPB 1000 mg/200 mL premix     1,000 mg 200 mL/hr over 60 Minutes Intravenous  Once 04/29/19 0810 04/29/19 1027   04/29/19 0745  ceFAZolin (ANCEF) IVPB 2g/100 mL premix     2 g 200 mL/hr over 30 Minutes Intravenous On call to O.R. 04/29/19 1308 04/29/19 1041     and started on DVT prophylaxis in the form of Xarelto.   PT and OT were ordered for total joint protocol. Discharge planning consulted to help with postop disposition and equipment  needs.  Patient had a good night on the evening of surgery. They started to get up OOB with therapy on POD #0. Pt was seen during rounds and was ready to go home pending progress with therapy. Hemovac drain was pulled without difficulty. She worked with therapy on POD #1 and was meeting her goals. Pt was  discharged to home later that day in stable condition.  Diet: Diabetic diet Activity: WBAT Follow-up: in 2 weeks Disposition: Home with OPPT at [facility] in [place] Discharged Condition: stable   Discharge Instructions    Call MD / Call 911   Complete by: As directed    If you experience chest pain or shortness of breath, CALL 911 and be transported to the hospital emergency room.  If you develope a fever above 101 F, pus (white drainage) or increased drainage or redness at the wound, or calf pain, call your surgeon's office.   Change dressing   Complete by: As directed    Change dressing on Wednesday, then change the dressing daily with sterile 4 x 4 inch gauze dressing and apply TED hose.   Constipation Prevention   Complete by: As directed    Drink plenty of fluids.  Prune juice may be helpful.  You may use a stool softener, such as Colace (over the counter) 100 mg twice a day.  Use MiraLax (over the counter) for constipation as needed.   Diet - low sodium heart healthy   Complete by: As directed    Discharge instructions   Complete by: As directed    Dr. Gaynelle Arabian Total Joint Specialist [facility] [address], [place], [place] [address] [phone]  TOTAL KNEE REPLACEMENT POSTOPERATIVE DIRECTIONS  Knee Rehabilitation, Guidelines Following Surgery  Results after knee surgery are often greatly improved when you follow the exercise, range of motion and muscle strengthening exercises prescribed by your doctor. Safety measures are also important to protect the knee from further injury. Any time any of these exercises cause you to have increased pain or swelling in your knee joint, decrease the amount until you are comfortable again and slowly increase them. If you have problems or questions, call your caregiver or physical therapist for advice.   HOME CARE INSTRUCTIONS  Remove items at home which could result in a fall. This includes throw rugs or furniture in walking  pathways.  ICE to the affected knee every three hours for 30 minutes at a time and then as needed for pain and swelling.  Continue to use ice on the knee for pain and swelling from surgery. You may notice swelling that will progress down to the foot and ankle.  This is normal after surgery.  Elevate the leg when you are not up walking on it.   Continue to use the breathing machine which will help keep your temperature down.  It is common for your temperature to cycle up and down following surgery, especially at night when you are not up moving around and exerting yourself.  The breathing machine keeps your lungs expanded and your temperature down. Do not place pillow under knee, focus on keeping the knee straight while resting   DIET You may resume your previous home diet once your are discharged from the hospital.  DRESSING / WOUND CARE / SHOWERING You may change your dressing 3-5 days after surgery.  Then change the dressing every day with sterile gauze.  Please use good hand washing techniques before changing the dressing.  Do not use  any lotions or creams on the incision until instructed by your surgeon. You may start showering once you are discharged home but do not submerge the incision under water. Just pat the incision dry and apply a dry gauze dressing on daily. Change the surgical dressing daily and reapply a dry dressing each time.  ACTIVITY Walk with your walker as instructed. Use walker as long as suggested by your caregivers. Avoid periods of inactivity such as sitting longer than an hour when not asleep. This helps prevent blood clots.  You may resume a sexual relationship in one month or when given the OK by your doctor.  You may return to work once you are cleared by your doctor.  Do not drive a car for 6 weeks or until released by you surgeon.  Do not drive while taking narcotics.  WEIGHT BEARING Weight bearing as tolerated with assist device (walker, cane, etc) as directed,  use it as long as suggested by your surgeon or therapist, typically at least 4-6 weeks.  POSTOPERATIVE CONSTIPATION PROTOCOL Constipation - defined medically as fewer than three stools per week and severe constipation as less than one stool per week.  One of the most common issues patients have following surgery is constipation.  Even if you have a regular bowel pattern at home, your normal regimen is likely to be disrupted due to multiple reasons following surgery.  Combination of anesthesia, postoperative narcotics, change in appetite and fluid intake all can affect your bowels.  In order to avoid complications following surgery, here are some recommendations in order to help you during your recovery period.  Colace (docusate) - Pick up an over-the-counter form of Colace or another stool softener and take twice a day as long as you are requiring postoperative pain medications.  Take with a full glass of water daily.  If you experience loose stools or diarrhea, hold the colace until you stool forms back up.  If your symptoms do not get better within 1 week or if they get worse, check with your doctor.  Dulcolax (bisacodyl) - Pick up over-the-counter and take as directed by the product packaging as needed to assist with the movement of your bowels.  Take with a full glass of water.  Use this product as needed if not relieved by Colace only.   MiraLax (polyethylene glycol) - Pick up over-the-counter to have on hand.  MiraLax is a solution that will increase the amount of water in your bowels to assist with bowel movements.  Take as directed and can mix with a glass of water, juice, soda, coffee, or tea.  Take if you go more than two days without a movement. Do not use MiraLax more than once per day. Call your doctor if you are still constipated or irregular after using this medication for 7 days in a row.  If you continue to have problems with postoperative constipation, please contact the office for  further assistance and recommendations.  If you experience "the worst abdominal pain ever" or develop nausea or vomiting, please contact the office immediatly for further recommendations for treatment.  ITCHING  If you experience itching with your medications, try taking only a single pain pill, or even half a pain pill at a time.  You can also use Benadryl over the counter for itching or also to help with sleep.   TED HOSE STOCKINGS Wear the elastic stockings on both legs for three weeks following surgery during the day but you may remove then at  night for sleeping.  MEDICATIONS See your medication summary on the "After Visit Summary" that the nursing staff will review with you prior to discharge.  You may have some home medications which will be placed on hold until you complete the course of blood thinner medication.  It is important for you to complete the blood thinner medication as prescribed by your surgeon.  Continue your approved medications as instructed at time of discharge.  PRECAUTIONS If you experience chest pain or shortness of breath - call 911 immediately for transfer to the hospital emergency department.  If you develop a fever greater that 101 F, purulent drainage from wound, increased redness or drainage from wound, foul odor from the wound/dressing, or calf pain - CONTACT YOUR SURGEON.                                                   FOLLOW-UP APPOINTMENTS Make sure you keep all of your appointments after your operation with your surgeon and caregivers. You should call the office at the above phone number and make an appointment for approximately two weeks after the date of your surgery or on the date instructed by your surgeon outlined in the "After Visit Summary".   RANGE OF MOTION AND STRENGTHENING EXERCISES  Rehabilitation of the knee is important following a knee injury or an operation. After just a few days of immobilization, the muscles of the thigh which control the  knee become weakened and shrink (atrophy). Knee exercises are designed to build up the tone and strength of the thigh muscles and to improve knee motion. Often times heat used for twenty to thirty minutes before working out will loosen up your tissues and help with improving the range of motion but do not use heat for the first two weeks following surgery. These exercises can be done on a training (exercise) mat, on the floor, on a table or on a bed. Use what ever works the best and is most comfortable for you Knee exercises include:  Leg Lifts - While your knee is still immobilized in a splint or cast, you can do straight leg raises. Lift the leg to 60 degrees, hold for 3 sec, and slowly lower the leg. Repeat 10-20 times 2-3 times daily. Perform this exercise against resistance later as your knee gets better.  Quad and Hamstring Sets - Tighten up the muscle on the front of the thigh (Quad) and hold for 5-10 sec. Repeat this 10-20 times hourly. Hamstring sets are done by pushing the foot backward against an object and holding for 5-10 sec. Repeat as with quad sets.  Leg Slides: Lying on your back, slowly slide your foot toward your buttocks, bending your knee up off the floor (only go as far as is comfortable). Then slowly slide your foot back down until your leg is flat on the floor again. Angel Wings: Lying on your back spread your legs to the side as far apart as you can without causing discomfort.  A rehabilitation program following serious knee injuries can speed recovery and prevent re-injury in the future due to weakened muscles. Contact your doctor or a physical therapist for more information on knee rehabilitation.   IF YOU ARE TRANSFERRED TO A SKILLED REHAB FACILITY If the patient is transferred to a skilled rehab facility following release from the hospital, a list  of the current medications will be sent to the facility for the patient to continue.  When discharged from the skilled rehab facility,  please have the facility set up the patient's [place] prior to being released. Also, the skilled facility will be responsible for providing the patient with their medications at time of release from the facility to include their pain medication, the muscle relaxants, and their blood thinner medication. If the patient is still at the rehab facility at time of the two week follow up appointment, the skilled rehab facility will also need to assist the patient in arranging follow up appointment in our office and any transportation needs.  MAKE SURE YOU:  Understand these instructions.  Get help right away if you are not doing well or get worse.    Pick up stool softner and laxative for home use following surgery while on pain medications. Do not submerge incision under water. Please use good hand washing techniques while changing dressing each day. May shower starting three days after surgery. Please use a clean towel to pat the incision dry following showers. Continue to use ice for pain and swelling after surgery. Do not use any lotions or creams on the incision until instructed by your surgeon.   Do not put a pillow under the knee. Place it under the heel.   Complete by: As directed    Driving restrictions   Complete by: As directed    No driving for two weeks   TED hose   Complete by: As directed    Use stockings (TED hose) for three weeks on both leg(s).  You may remove them at night for sleeping.   Weight bearing as tolerated   Complete by: As directed      Allergies as of 04/30/2019      Reactions   Aspirin Nausea Only   ASPIRIN 81 ASPIRIN LOW  ASPIRIN MAX  ASPIRIN ADULT LOW STRENGTH   Penicillins Nausea Only   Did it involve swelling of the face/tongue/throat, SOB, or low BP? N Did it involve sudden or severe rash/hives, skin peeling, or any reaction on the inside of your mouth or nose? N Did you need to seek medical attention at a hospital or doctor's  office? N When did it last happen?Several years ago If all above answers are "NO", may proceed with cephalosporin use.      Medication List    STOP taking these medications   Biotin 10 MG Caps   Cinnamon 500 MG Tabs   diclofenac 75 MG EC tablet Commonly known as: VOLTAREN   Fish Oil 1000 MG Caps   multivitamin tablet     TAKE these medications   acetaminophen 500 MG tablet Commonly known as: TYLENOL Take 500 mg by mouth every 6 (six) hours as needed for moderate pain.   amLODipine 5 MG tablet Commonly known as: NORVASC Take 5 mg by mouth every evening.   FLUoxetine 40 MG capsule Commonly known as: PROZAC Take 40 mg by mouth every evening.   gabapentin 300 MG capsule Commonly known as: NEURONTIN Take 300 mg by mouth 3 (three) times daily.   glipiZIDE 10 MG tablet Commonly known as: GLUCOTROL Take 10 mg by mouth daily before breakfast.   metFORMIN 500 MG tablet Commonly known as: GLUCOPHAGE Take 500 mg by mouth 2 (two) times daily with a meal.   methocarbamol 500 MG tablet Commonly known as: ROBAXIN Take 1 tablet (500 mg total) by mouth every 6 (six)  hours as needed for muscle spasms.   oxyCODONE 5 MG immediate release tablet Commonly known as: Oxy IR/ROXICODONE Take 1-2 tablets (5-10 mg total) by mouth every 6 (six) hours as needed for moderate pain (pain score 4-6).   pantoprazole 40 MG tablet Commonly known as: PROTONIX Take 40 mg by mouth daily.   PROBIOTIC ACIDOPHILUS PO Take 1 capsule by mouth daily. Daily   rivaroxaban 10 MG Tabs tablet Commonly known as: XARELTO Take 1 tablet (10 mg total) by mouth daily with breakfast for 20 days.   Rybelsus 7 MG Tabs Generic drug: Semaglutide Take 7 mg by mouth.            Discharge Care Instructions  (From admission, onward)         Start     Ordered   04/30/19 0000  Weight bearing as tolerated     04/30/19 0711   04/30/19 0000  Change dressing    Comments: Change dressing on Wednesday,  then change the dressing daily with sterile 4 x 4 inch gauze dressing and apply TED hose.   04/30/19 3382         Follow-up Information    Gaynelle Arabian, MD. Schedule an appointment as soon as possible for a visit on 05/14/2019.   Specialty: Orthopedic Surgery Contact information: [address] [place] [address] [phone]           Signed: Theresa Duty, PA-C Orthopedic Surgery 05/01/2019, 10:27 AM

## 2021-03-18 ENCOUNTER — Other Ambulatory Visit: Payer: Self-pay | Admitting: Nurse Practitioner

## 2021-03-18 DIAGNOSIS — Z139 Encounter for screening, unspecified: Secondary | ICD-10-CM

## 2021-03-19 ENCOUNTER — Other Ambulatory Visit: Payer: Self-pay

## 2021-03-19 ENCOUNTER — Ambulatory Visit
Admission: RE | Admit: 2021-03-19 | Discharge: 2021-03-19 | Disposition: A | Discharge status: Home / Self Care | Payer: 59 | Source: Ambulatory Visit | Attending: Nurse Practitioner | Admitting: Nurse Practitioner

## 2021-03-19 DIAGNOSIS — Z139 Encounter for screening, unspecified: Secondary | ICD-10-CM

## 2021-11-30 DIAGNOSIS — T466X5A Adverse effect of antihyperlipidemic and antiarteriosclerotic drugs, initial encounter: Secondary | ICD-10-CM | POA: Diagnosis not present

## 2021-11-30 DIAGNOSIS — F339 Major depressive disorder, recurrent, unspecified: Secondary | ICD-10-CM | POA: Diagnosis not present

## 2021-11-30 DIAGNOSIS — M791 Myalgia, unspecified site: Secondary | ICD-10-CM | POA: Diagnosis not present

## 2021-11-30 DIAGNOSIS — I10 Essential (primary) hypertension: Secondary | ICD-10-CM | POA: Diagnosis not present

## 2021-11-30 DIAGNOSIS — E1165 Type 2 diabetes mellitus with hyperglycemia: Secondary | ICD-10-CM | POA: Diagnosis not present

## 2021-11-30 DIAGNOSIS — Z299 Encounter for prophylactic measures, unspecified: Secondary | ICD-10-CM | POA: Diagnosis not present

## 2021-12-09 DIAGNOSIS — F339 Major depressive disorder, recurrent, unspecified: Secondary | ICD-10-CM | POA: Diagnosis not present

## 2021-12-09 DIAGNOSIS — A084 Viral intestinal infection, unspecified: Secondary | ICD-10-CM | POA: Diagnosis not present

## 2021-12-09 DIAGNOSIS — Z299 Encounter for prophylactic measures, unspecified: Secondary | ICD-10-CM | POA: Diagnosis not present

## 2021-12-09 DIAGNOSIS — Z87891 Personal history of nicotine dependence: Secondary | ICD-10-CM | POA: Diagnosis not present

## 2021-12-09 DIAGNOSIS — I10 Essential (primary) hypertension: Secondary | ICD-10-CM | POA: Diagnosis not present

## 2021-12-09 DIAGNOSIS — E1165 Type 2 diabetes mellitus with hyperglycemia: Secondary | ICD-10-CM | POA: Diagnosis not present

## 2022-01-04 DIAGNOSIS — E1165 Type 2 diabetes mellitus with hyperglycemia: Secondary | ICD-10-CM | POA: Diagnosis not present

## 2022-01-04 DIAGNOSIS — Z299 Encounter for prophylactic measures, unspecified: Secondary | ICD-10-CM | POA: Diagnosis not present

## 2022-01-04 DIAGNOSIS — R413 Other amnesia: Secondary | ICD-10-CM | POA: Diagnosis not present

## 2022-01-04 DIAGNOSIS — Z1211 Encounter for screening for malignant neoplasm of colon: Secondary | ICD-10-CM | POA: Diagnosis not present

## 2022-01-04 DIAGNOSIS — I10 Essential (primary) hypertension: Secondary | ICD-10-CM | POA: Diagnosis not present

## 2022-01-13 ENCOUNTER — Encounter (INDEPENDENT_AMBULATORY_CARE_PROVIDER_SITE_OTHER): Payer: Self-pay | Admitting: *Deleted

## 2022-02-15 DIAGNOSIS — H35463 Secondary vitreoretinal degeneration, bilateral: Secondary | ICD-10-CM | POA: Diagnosis not present

## 2022-02-15 DIAGNOSIS — H3540 Unspecified peripheral retinal degeneration: Secondary | ICD-10-CM | POA: Diagnosis not present

## 2022-02-15 DIAGNOSIS — Z0100 Encounter for examination of eyes and vision without abnormal findings: Secondary | ICD-10-CM | POA: Diagnosis not present

## 2022-02-15 DIAGNOSIS — H25013 Cortical age-related cataract, bilateral: Secondary | ICD-10-CM | POA: Diagnosis not present

## 2022-02-15 DIAGNOSIS — H5203 Hypermetropia, bilateral: Secondary | ICD-10-CM | POA: Diagnosis not present

## 2022-02-15 DIAGNOSIS — H25813 Combined forms of age-related cataract, bilateral: Secondary | ICD-10-CM | POA: Diagnosis not present

## 2022-02-15 DIAGNOSIS — H2513 Age-related nuclear cataract, bilateral: Secondary | ICD-10-CM | POA: Diagnosis not present

## 2022-02-15 DIAGNOSIS — H52223 Regular astigmatism, bilateral: Secondary | ICD-10-CM | POA: Diagnosis not present

## 2022-02-15 DIAGNOSIS — H524 Presbyopia: Secondary | ICD-10-CM | POA: Diagnosis not present

## 2022-02-15 DIAGNOSIS — H47323 Drusen of optic disc, bilateral: Secondary | ICD-10-CM | POA: Diagnosis not present

## 2022-03-23 DIAGNOSIS — Z789 Other specified health status: Secondary | ICD-10-CM | POA: Diagnosis not present

## 2022-03-23 DIAGNOSIS — Z0000 Encounter for general adult medical examination without abnormal findings: Secondary | ICD-10-CM | POA: Diagnosis not present

## 2022-03-23 DIAGNOSIS — Z1331 Encounter for screening for depression: Secondary | ICD-10-CM | POA: Diagnosis not present

## 2022-03-23 DIAGNOSIS — R5383 Other fatigue: Secondary | ICD-10-CM | POA: Diagnosis not present

## 2022-03-23 DIAGNOSIS — E1165 Type 2 diabetes mellitus with hyperglycemia: Secondary | ICD-10-CM | POA: Diagnosis not present

## 2022-03-23 DIAGNOSIS — I10 Essential (primary) hypertension: Secondary | ICD-10-CM | POA: Diagnosis not present

## 2022-03-23 DIAGNOSIS — E7800 Pure hypercholesterolemia, unspecified: Secondary | ICD-10-CM | POA: Diagnosis not present

## 2022-03-23 DIAGNOSIS — Z1339 Encounter for screening examination for other mental health and behavioral disorders: Secondary | ICD-10-CM | POA: Diagnosis not present

## 2022-03-23 DIAGNOSIS — Z79899 Other long term (current) drug therapy: Secondary | ICD-10-CM | POA: Diagnosis not present

## 2022-03-23 DIAGNOSIS — Z6837 Body mass index (BMI) 37.0-37.9, adult: Secondary | ICD-10-CM | POA: Diagnosis not present

## 2022-03-23 DIAGNOSIS — Z7189 Other specified counseling: Secondary | ICD-10-CM | POA: Diagnosis not present

## 2022-03-23 DIAGNOSIS — H6120 Impacted cerumen, unspecified ear: Secondary | ICD-10-CM | POA: Diagnosis not present

## 2022-03-30 DIAGNOSIS — Z299 Encounter for prophylactic measures, unspecified: Secondary | ICD-10-CM | POA: Diagnosis not present

## 2022-03-30 DIAGNOSIS — Z6838 Body mass index (BMI) 38.0-38.9, adult: Secondary | ICD-10-CM | POA: Diagnosis not present

## 2022-03-30 DIAGNOSIS — I10 Essential (primary) hypertension: Secondary | ICD-10-CM | POA: Diagnosis not present

## 2022-03-30 DIAGNOSIS — H6121 Impacted cerumen, right ear: Secondary | ICD-10-CM | POA: Diagnosis not present

## 2022-04-12 DIAGNOSIS — E1165 Type 2 diabetes mellitus with hyperglycemia: Secondary | ICD-10-CM | POA: Diagnosis not present

## 2022-04-12 DIAGNOSIS — R52 Pain, unspecified: Secondary | ICD-10-CM | POA: Diagnosis not present

## 2022-04-12 DIAGNOSIS — U071 COVID-19: Secondary | ICD-10-CM | POA: Diagnosis not present

## 2022-04-12 DIAGNOSIS — F339 Major depressive disorder, recurrent, unspecified: Secondary | ICD-10-CM | POA: Diagnosis not present

## 2022-04-12 DIAGNOSIS — Z299 Encounter for prophylactic measures, unspecified: Secondary | ICD-10-CM | POA: Diagnosis not present

## 2022-04-12 DIAGNOSIS — Z789 Other specified health status: Secondary | ICD-10-CM | POA: Diagnosis not present

## 2022-04-22 ENCOUNTER — Other Ambulatory Visit: Payer: Self-pay | Admitting: Nurse Practitioner

## 2022-04-22 DIAGNOSIS — Z1231 Encounter for screening mammogram for malignant neoplasm of breast: Secondary | ICD-10-CM

## 2022-04-27 ENCOUNTER — Ambulatory Visit
Admission: RE | Admit: 2022-04-27 | Discharge: 2022-04-27 | Disposition: A | Discharge status: Home / Self Care | Payer: Medicare HMO | Source: Ambulatory Visit | Attending: Nurse Practitioner | Admitting: Nurse Practitioner

## 2022-04-27 DIAGNOSIS — Z1231 Encounter for screening mammogram for malignant neoplasm of breast: Secondary | ICD-10-CM | POA: Diagnosis not present

## 2022-06-21 ENCOUNTER — Encounter (INDEPENDENT_AMBULATORY_CARE_PROVIDER_SITE_OTHER): Payer: Self-pay | Admitting: *Deleted

## 2022-07-08 DIAGNOSIS — F339 Major depressive disorder, recurrent, unspecified: Secondary | ICD-10-CM | POA: Diagnosis not present

## 2022-07-08 DIAGNOSIS — I10 Essential (primary) hypertension: Secondary | ICD-10-CM | POA: Diagnosis not present

## 2022-07-08 DIAGNOSIS — E1165 Type 2 diabetes mellitus with hyperglycemia: Secondary | ICD-10-CM | POA: Diagnosis not present

## 2022-07-08 DIAGNOSIS — Z713 Dietary counseling and surveillance: Secondary | ICD-10-CM | POA: Diagnosis not present

## 2022-07-08 DIAGNOSIS — Z23 Encounter for immunization: Secondary | ICD-10-CM | POA: Diagnosis not present

## 2022-07-08 DIAGNOSIS — Z299 Encounter for prophylactic measures, unspecified: Secondary | ICD-10-CM | POA: Diagnosis not present

## 2022-08-08 DIAGNOSIS — E1165 Type 2 diabetes mellitus with hyperglycemia: Secondary | ICD-10-CM | POA: Diagnosis not present

## 2022-08-08 DIAGNOSIS — I10 Essential (primary) hypertension: Secondary | ICD-10-CM | POA: Diagnosis not present

## 2022-08-08 DIAGNOSIS — Z299 Encounter for prophylactic measures, unspecified: Secondary | ICD-10-CM | POA: Diagnosis not present

## 2022-08-18 DIAGNOSIS — H40013 Open angle with borderline findings, low risk, bilateral: Secondary | ICD-10-CM | POA: Diagnosis not present

## 2022-10-17 DIAGNOSIS — Z6838 Body mass index (BMI) 38.0-38.9, adult: Secondary | ICD-10-CM | POA: Diagnosis not present

## 2022-10-17 DIAGNOSIS — F339 Major depressive disorder, recurrent, unspecified: Secondary | ICD-10-CM | POA: Diagnosis not present

## 2022-10-17 DIAGNOSIS — Z299 Encounter for prophylactic measures, unspecified: Secondary | ICD-10-CM | POA: Diagnosis not present

## 2022-10-17 DIAGNOSIS — I10 Essential (primary) hypertension: Secondary | ICD-10-CM | POA: Diagnosis not present

## 2022-10-17 DIAGNOSIS — E1165 Type 2 diabetes mellitus with hyperglycemia: Secondary | ICD-10-CM | POA: Diagnosis not present

## 2022-10-17 DIAGNOSIS — M179 Osteoarthritis of knee, unspecified: Secondary | ICD-10-CM | POA: Diagnosis not present

## 2022-12-01 DIAGNOSIS — I10 Essential (primary) hypertension: Secondary | ICD-10-CM | POA: Diagnosis not present

## 2022-12-01 DIAGNOSIS — M179 Osteoarthritis of knee, unspecified: Secondary | ICD-10-CM | POA: Diagnosis not present

## 2022-12-01 DIAGNOSIS — E1165 Type 2 diabetes mellitus with hyperglycemia: Secondary | ICD-10-CM | POA: Diagnosis not present

## 2022-12-01 DIAGNOSIS — F339 Major depressive disorder, recurrent, unspecified: Secondary | ICD-10-CM | POA: Diagnosis not present

## 2022-12-01 DIAGNOSIS — Z299 Encounter for prophylactic measures, unspecified: Secondary | ICD-10-CM | POA: Diagnosis not present

## 2022-12-05 DIAGNOSIS — Z043 Encounter for examination and observation following other accident: Secondary | ICD-10-CM | POA: Diagnosis not present

## 2022-12-05 DIAGNOSIS — M79651 Pain in right thigh: Secondary | ICD-10-CM | POA: Diagnosis not present

## 2022-12-05 DIAGNOSIS — Z299 Encounter for prophylactic measures, unspecified: Secondary | ICD-10-CM | POA: Diagnosis not present

## 2022-12-05 DIAGNOSIS — M1611 Unilateral primary osteoarthritis, right hip: Secondary | ICD-10-CM | POA: Diagnosis not present

## 2022-12-05 DIAGNOSIS — I10 Essential (primary) hypertension: Secondary | ICD-10-CM | POA: Diagnosis not present

## 2022-12-20 DIAGNOSIS — E1165 Type 2 diabetes mellitus with hyperglycemia: Secondary | ICD-10-CM | POA: Diagnosis not present

## 2022-12-20 DIAGNOSIS — Z299 Encounter for prophylactic measures, unspecified: Secondary | ICD-10-CM | POA: Diagnosis not present

## 2022-12-20 DIAGNOSIS — I10 Essential (primary) hypertension: Secondary | ICD-10-CM | POA: Diagnosis not present

## 2022-12-20 DIAGNOSIS — M545 Low back pain, unspecified: Secondary | ICD-10-CM | POA: Diagnosis not present

## 2023-01-25 DIAGNOSIS — Z299 Encounter for prophylactic measures, unspecified: Secondary | ICD-10-CM | POA: Diagnosis not present

## 2023-01-25 DIAGNOSIS — N390 Urinary tract infection, site not specified: Secondary | ICD-10-CM | POA: Diagnosis not present

## 2023-01-25 DIAGNOSIS — I10 Essential (primary) hypertension: Secondary | ICD-10-CM | POA: Diagnosis not present

## 2023-01-25 DIAGNOSIS — B3731 Acute candidiasis of vulva and vagina: Secondary | ICD-10-CM | POA: Diagnosis not present

## 2023-01-25 DIAGNOSIS — E1165 Type 2 diabetes mellitus with hyperglycemia: Secondary | ICD-10-CM | POA: Diagnosis not present

## 2023-03-02 DIAGNOSIS — M069 Rheumatoid arthritis, unspecified: Secondary | ICD-10-CM | POA: Diagnosis not present

## 2023-03-02 DIAGNOSIS — H524 Presbyopia: Secondary | ICD-10-CM | POA: Diagnosis not present

## 2023-03-02 DIAGNOSIS — Z0100 Encounter for examination of eyes and vision without abnormal findings: Secondary | ICD-10-CM | POA: Diagnosis not present

## 2023-03-16 DIAGNOSIS — I10 Essential (primary) hypertension: Secondary | ICD-10-CM | POA: Diagnosis not present

## 2023-03-16 DIAGNOSIS — M722 Plantar fascial fibromatosis: Secondary | ICD-10-CM | POA: Diagnosis not present

## 2023-03-16 DIAGNOSIS — Z299 Encounter for prophylactic measures, unspecified: Secondary | ICD-10-CM | POA: Diagnosis not present

## 2023-03-16 DIAGNOSIS — L299 Pruritus, unspecified: Secondary | ICD-10-CM | POA: Diagnosis not present

## 2023-03-29 DIAGNOSIS — Z299 Encounter for prophylactic measures, unspecified: Secondary | ICD-10-CM | POA: Diagnosis not present

## 2023-03-29 DIAGNOSIS — M722 Plantar fascial fibromatosis: Secondary | ICD-10-CM | POA: Diagnosis not present

## 2023-04-04 ENCOUNTER — Other Ambulatory Visit: Payer: Self-pay | Admitting: Family Medicine

## 2023-04-04 DIAGNOSIS — Z0000 Encounter for general adult medical examination without abnormal findings: Secondary | ICD-10-CM | POA: Diagnosis not present

## 2023-04-04 DIAGNOSIS — E7800 Pure hypercholesterolemia, unspecified: Secondary | ICD-10-CM | POA: Diagnosis not present

## 2023-04-04 DIAGNOSIS — F339 Major depressive disorder, recurrent, unspecified: Secondary | ICD-10-CM | POA: Diagnosis not present

## 2023-04-04 DIAGNOSIS — Z299 Encounter for prophylactic measures, unspecified: Secondary | ICD-10-CM | POA: Diagnosis not present

## 2023-04-04 DIAGNOSIS — Z1231 Encounter for screening mammogram for malignant neoplasm of breast: Secondary | ICD-10-CM

## 2023-04-04 DIAGNOSIS — Z1331 Encounter for screening for depression: Secondary | ICD-10-CM | POA: Diagnosis not present

## 2023-04-04 DIAGNOSIS — Z79899 Other long term (current) drug therapy: Secondary | ICD-10-CM | POA: Diagnosis not present

## 2023-04-04 DIAGNOSIS — Z7189 Other specified counseling: Secondary | ICD-10-CM | POA: Diagnosis not present

## 2023-04-04 DIAGNOSIS — Z1339 Encounter for screening examination for other mental health and behavioral disorders: Secondary | ICD-10-CM | POA: Diagnosis not present

## 2023-04-04 DIAGNOSIS — R5383 Other fatigue: Secondary | ICD-10-CM | POA: Diagnosis not present

## 2023-05-02 ENCOUNTER — Ambulatory Visit
Admission: RE | Admit: 2023-05-02 | Discharge: 2023-05-02 | Disposition: A | Discharge status: Home / Self Care | Payer: Medicare HMO | Source: Ambulatory Visit | Attending: Family Medicine | Admitting: Family Medicine

## 2023-05-02 DIAGNOSIS — Z1231 Encounter for screening mammogram for malignant neoplasm of breast: Secondary | ICD-10-CM | POA: Diagnosis not present

## 2023-05-02 DIAGNOSIS — Z299 Encounter for prophylactic measures, unspecified: Secondary | ICD-10-CM | POA: Diagnosis not present

## 2023-05-02 DIAGNOSIS — I10 Essential (primary) hypertension: Secondary | ICD-10-CM | POA: Diagnosis not present

## 2023-05-02 DIAGNOSIS — B350 Tinea barbae and tinea capitis: Secondary | ICD-10-CM | POA: Diagnosis not present

## 2023-05-02 DIAGNOSIS — L658 Other specified nonscarring hair loss: Secondary | ICD-10-CM | POA: Diagnosis not present

## 2023-05-02 DIAGNOSIS — E1165 Type 2 diabetes mellitus with hyperglycemia: Secondary | ICD-10-CM | POA: Diagnosis not present

## 2023-05-10 DIAGNOSIS — B379 Candidiasis, unspecified: Secondary | ICD-10-CM | POA: Diagnosis not present

## 2023-05-10 DIAGNOSIS — Z6836 Body mass index (BMI) 36.0-36.9, adult: Secondary | ICD-10-CM | POA: Diagnosis not present

## 2023-05-10 DIAGNOSIS — Z299 Encounter for prophylactic measures, unspecified: Secondary | ICD-10-CM | POA: Diagnosis not present

## 2023-05-10 DIAGNOSIS — M791 Myalgia, unspecified site: Secondary | ICD-10-CM | POA: Diagnosis not present

## 2023-05-28 IMAGING — MG MM DIGITAL SCREENING BILAT W/ TOMO AND CAD
8 series · 9 of 24 positions shown · non-contrast
Comparison: Previous exam(s).

CLINICAL DATA: Screening.

EXAM:
DIGITAL SCREENING BILATERAL MAMMOGRAM WITH TOMOSYNTHESIS AND CAD
TECHNIQUE: Bilateral screening digital craniocaudal and mediolateral oblique
mammograms were obtained. Bilateral screening digital breast
tomosynthesis was performed. The images were evaluated with
computer-aided detection.

[L MLO synth-2D]
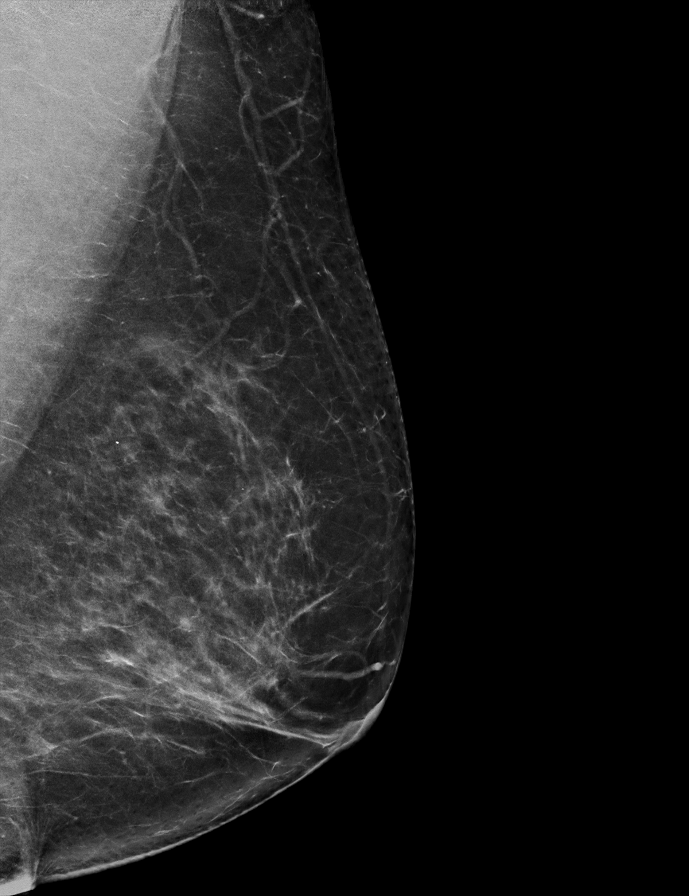

[L CC synth-2D]
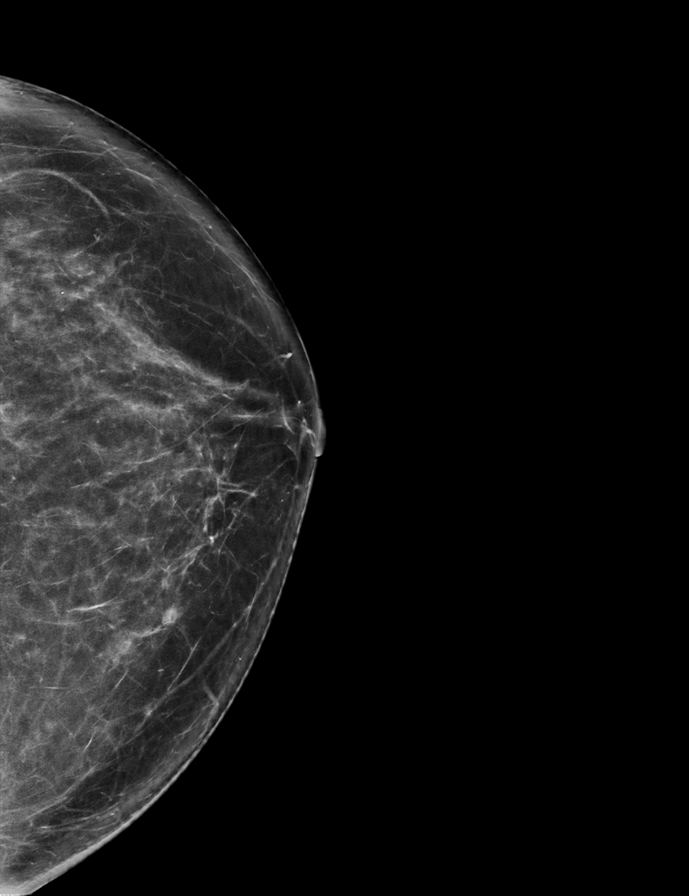

[R CC synth-2D]
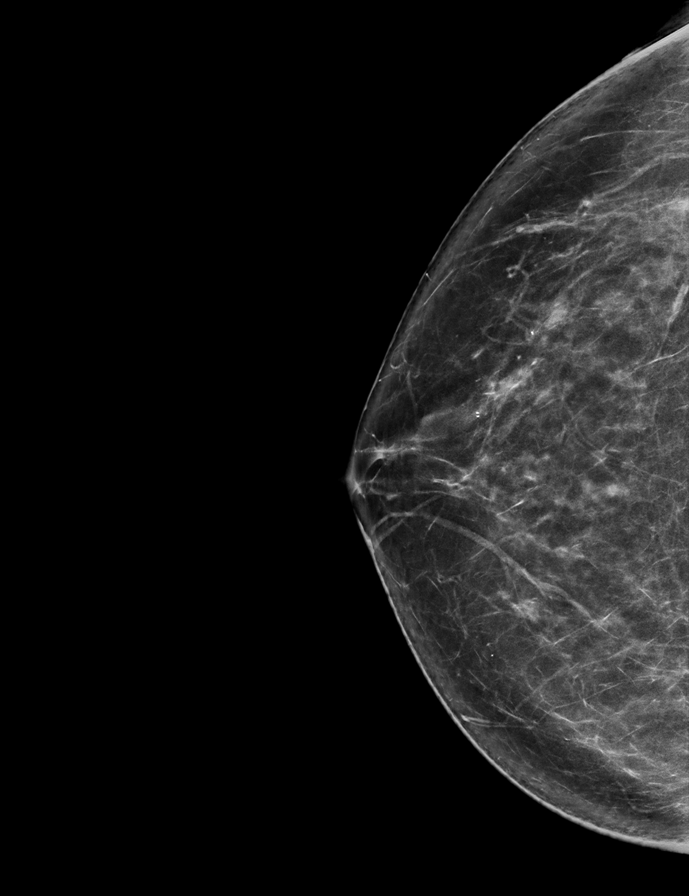

[R MLO synth-2D]
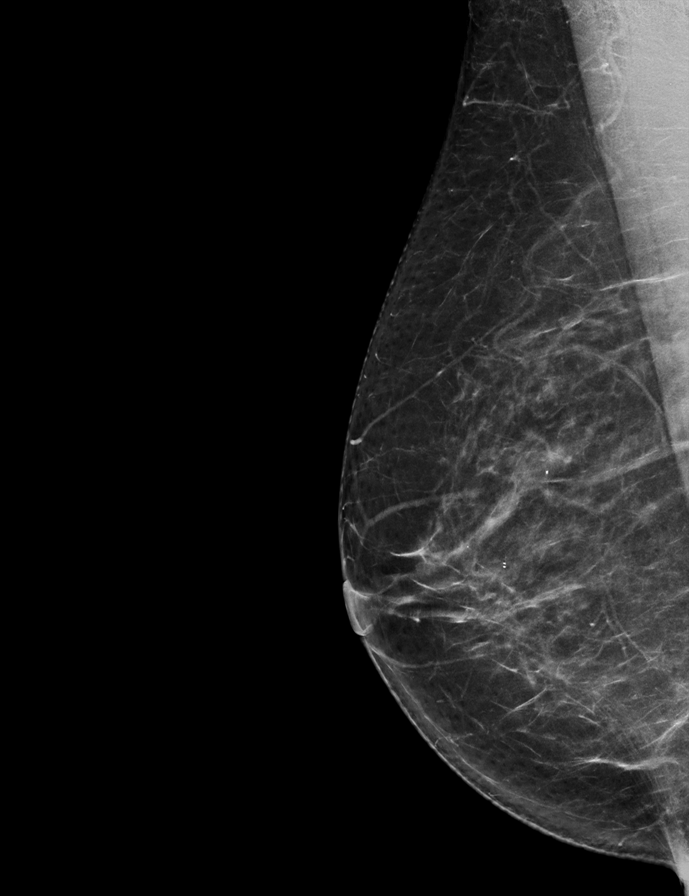

[L MLO tomo · 2 of 79 frames shown]
[frame 26/79]
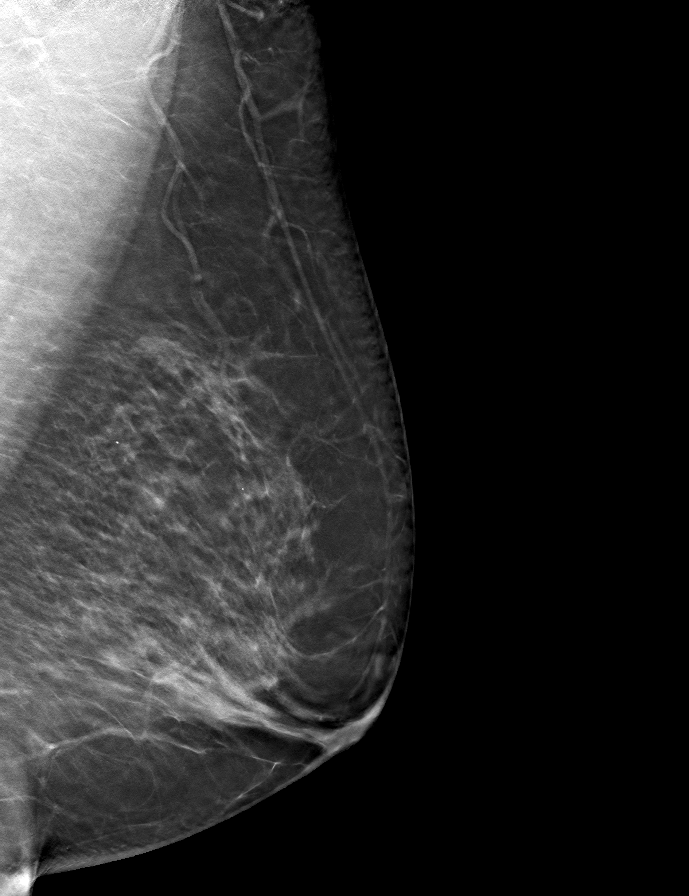
[frame 40/79]
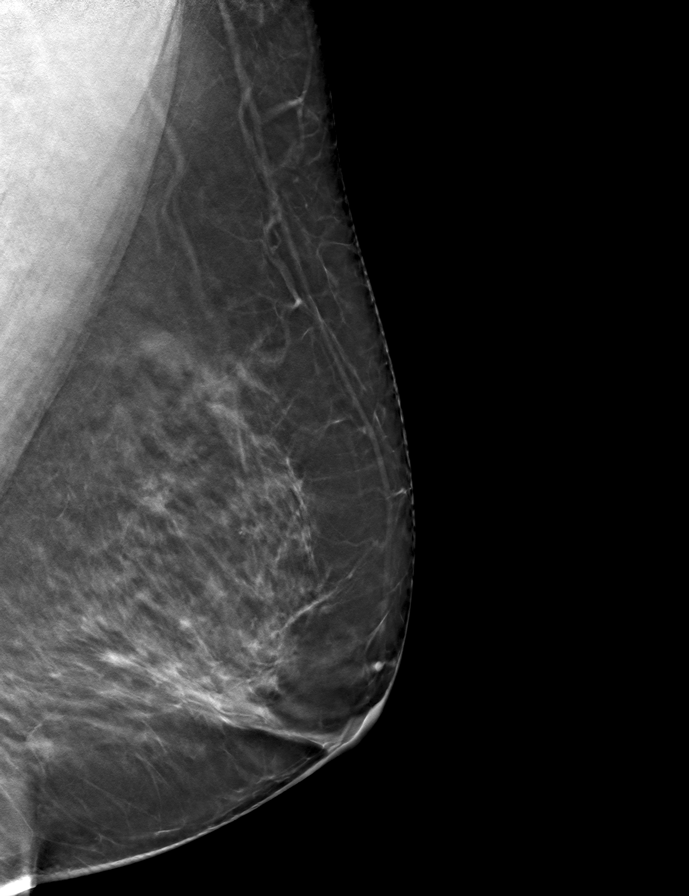

[R CC tomo · tomo slice 38/75.0]
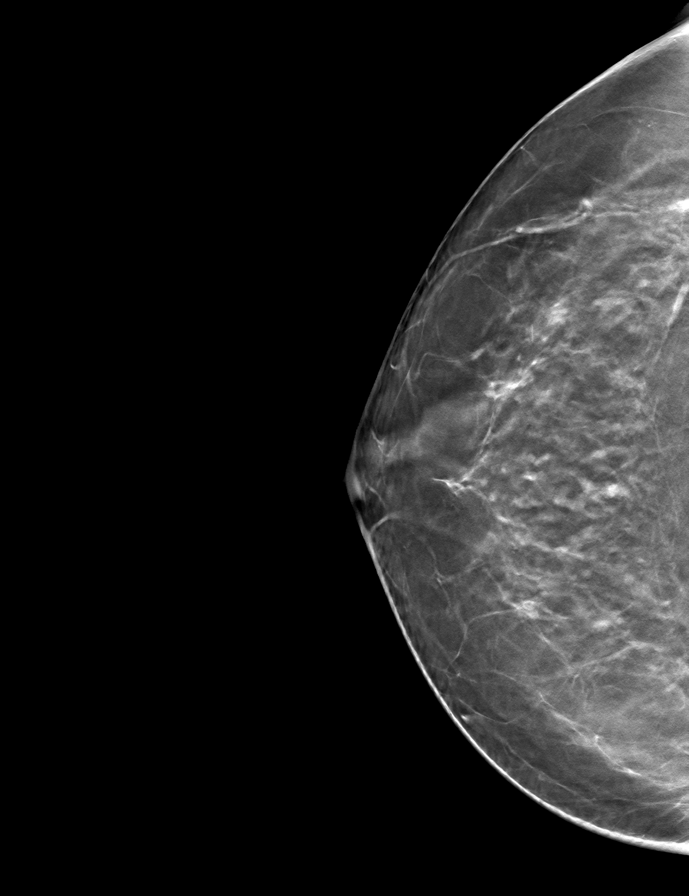

[L CC tomo · tomo slice 39/76.0]
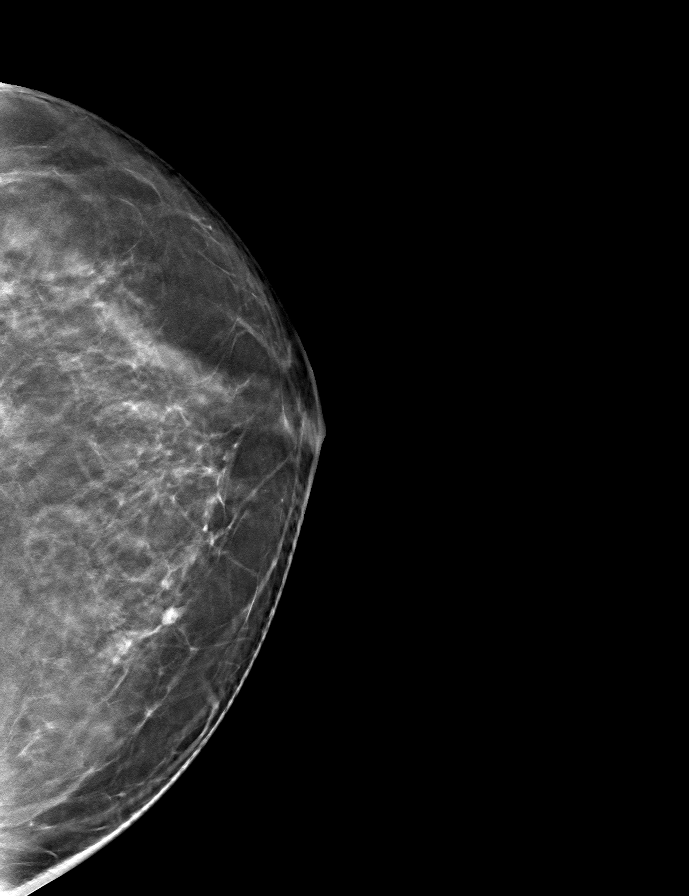

[R MLO tomo · tomo slice 39/77.0]
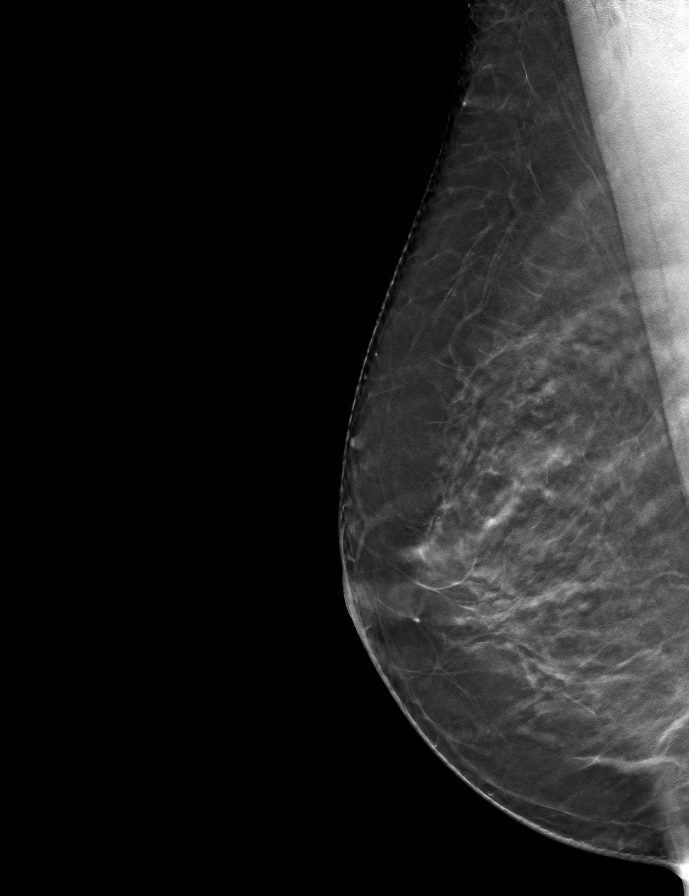

[9 of 24 positions shown; findings below may reference images not displayed]

ACR Breast Density Category b: There are scattered areas of
fibroglandular density.
FINDINGS: There are no findings suspicious for malignancy.
IMPRESSION: No mammographic evidence of malignancy. A result letter of this
screening mammogram will be mailed directly to the patient.

RECOMMENDATION:
Screening mammogram in one year. (Code:51-O-LD2)

BI-RADS CATEGORY  1: Negative.

## 2023-06-02 DIAGNOSIS — Z299 Encounter for prophylactic measures, unspecified: Secondary | ICD-10-CM | POA: Diagnosis not present

## 2023-06-02 DIAGNOSIS — I10 Essential (primary) hypertension: Secondary | ICD-10-CM | POA: Diagnosis not present

## 2023-06-02 DIAGNOSIS — E1165 Type 2 diabetes mellitus with hyperglycemia: Secondary | ICD-10-CM | POA: Diagnosis not present

## 2023-07-03 DIAGNOSIS — I10 Essential (primary) hypertension: Secondary | ICD-10-CM | POA: Diagnosis not present

## 2023-07-03 DIAGNOSIS — M545 Low back pain, unspecified: Secondary | ICD-10-CM | POA: Diagnosis not present

## 2023-07-03 DIAGNOSIS — Z23 Encounter for immunization: Secondary | ICD-10-CM | POA: Diagnosis not present

## 2023-07-03 DIAGNOSIS — E1165 Type 2 diabetes mellitus with hyperglycemia: Secondary | ICD-10-CM | POA: Diagnosis not present

## 2023-07-03 DIAGNOSIS — Z299 Encounter for prophylactic measures, unspecified: Secondary | ICD-10-CM | POA: Diagnosis not present

## 2023-07-03 DIAGNOSIS — E2839 Other primary ovarian failure: Secondary | ICD-10-CM | POA: Diagnosis not present

## 2023-07-17 DIAGNOSIS — L648 Other androgenic alopecia: Secondary | ICD-10-CM | POA: Diagnosis not present

## 2023-07-17 DIAGNOSIS — L658 Other specified nonscarring hair loss: Secondary | ICD-10-CM | POA: Diagnosis not present

## 2023-07-19 DIAGNOSIS — Z299 Encounter for prophylactic measures, unspecified: Secondary | ICD-10-CM | POA: Diagnosis not present

## 2023-07-19 DIAGNOSIS — E1169 Type 2 diabetes mellitus with other specified complication: Secondary | ICD-10-CM | POA: Diagnosis not present

## 2023-07-19 DIAGNOSIS — E1165 Type 2 diabetes mellitus with hyperglycemia: Secondary | ICD-10-CM | POA: Diagnosis not present

## 2023-07-19 DIAGNOSIS — I10 Essential (primary) hypertension: Secondary | ICD-10-CM | POA: Diagnosis not present

## 2023-07-19 DIAGNOSIS — R350 Frequency of micturition: Secondary | ICD-10-CM | POA: Diagnosis not present

## 2023-08-01 DIAGNOSIS — M069 Rheumatoid arthritis, unspecified: Secondary | ICD-10-CM | POA: Diagnosis not present

## 2023-08-01 DIAGNOSIS — H35033 Hypertensive retinopathy, bilateral: Secondary | ICD-10-CM | POA: Diagnosis not present

## 2023-08-04 DIAGNOSIS — I10 Essential (primary) hypertension: Secondary | ICD-10-CM | POA: Diagnosis not present

## 2023-08-04 DIAGNOSIS — E1165 Type 2 diabetes mellitus with hyperglycemia: Secondary | ICD-10-CM | POA: Diagnosis not present

## 2023-08-04 DIAGNOSIS — Z299 Encounter for prophylactic measures, unspecified: Secondary | ICD-10-CM | POA: Diagnosis not present

## 2023-08-24 DIAGNOSIS — Z800 Family history of malignant neoplasm of digestive organs: Secondary | ICD-10-CM | POA: Diagnosis not present

## 2023-08-24 DIAGNOSIS — Z1211 Encounter for screening for malignant neoplasm of colon: Secondary | ICD-10-CM | POA: Diagnosis not present

## 2023-09-26 DIAGNOSIS — Z299 Encounter for prophylactic measures, unspecified: Secondary | ICD-10-CM | POA: Diagnosis not present

## 2023-09-26 DIAGNOSIS — Z6838 Body mass index (BMI) 38.0-38.9, adult: Secondary | ICD-10-CM | POA: Diagnosis not present

## 2023-09-26 DIAGNOSIS — E1165 Type 2 diabetes mellitus with hyperglycemia: Secondary | ICD-10-CM | POA: Diagnosis not present

## 2023-09-29 DIAGNOSIS — D123 Benign neoplasm of transverse colon: Secondary | ICD-10-CM | POA: Diagnosis not present

## 2023-09-29 DIAGNOSIS — E119 Type 2 diabetes mellitus without complications: Secondary | ICD-10-CM | POA: Diagnosis not present

## 2023-09-29 DIAGNOSIS — Z800 Family history of malignant neoplasm of digestive organs: Secondary | ICD-10-CM | POA: Diagnosis not present

## 2023-09-29 DIAGNOSIS — I10 Essential (primary) hypertension: Secondary | ICD-10-CM | POA: Diagnosis not present

## 2023-09-29 DIAGNOSIS — D122 Benign neoplasm of ascending colon: Secondary | ICD-10-CM | POA: Diagnosis not present

## 2023-09-29 DIAGNOSIS — R011 Cardiac murmur, unspecified: Secondary | ICD-10-CM | POA: Diagnosis not present

## 2023-09-29 DIAGNOSIS — K5730 Diverticulosis of large intestine without perforation or abscess without bleeding: Secondary | ICD-10-CM | POA: Diagnosis not present

## 2023-09-29 DIAGNOSIS — K648 Other hemorrhoids: Secondary | ICD-10-CM | POA: Diagnosis not present

## 2023-09-29 DIAGNOSIS — Z1211 Encounter for screening for malignant neoplasm of colon: Secondary | ICD-10-CM | POA: Diagnosis not present

## 2023-09-29 DIAGNOSIS — K644 Residual hemorrhoidal skin tags: Secondary | ICD-10-CM | POA: Diagnosis not present

## 2023-10-12 DIAGNOSIS — Z800 Family history of malignant neoplasm of digestive organs: Secondary | ICD-10-CM | POA: Diagnosis not present

## 2023-10-12 DIAGNOSIS — D126 Benign neoplasm of colon, unspecified: Secondary | ICD-10-CM | POA: Diagnosis not present

## 2023-10-13 DIAGNOSIS — E2839 Other primary ovarian failure: Secondary | ICD-10-CM | POA: Diagnosis not present

## 2023-11-13 DIAGNOSIS — Z299 Encounter for prophylactic measures, unspecified: Secondary | ICD-10-CM | POA: Diagnosis not present

## 2023-11-13 DIAGNOSIS — E1165 Type 2 diabetes mellitus with hyperglycemia: Secondary | ICD-10-CM | POA: Diagnosis not present

## 2023-11-13 DIAGNOSIS — F339 Major depressive disorder, recurrent, unspecified: Secondary | ICD-10-CM | POA: Diagnosis not present

## 2023-11-13 DIAGNOSIS — I10 Essential (primary) hypertension: Secondary | ICD-10-CM | POA: Diagnosis not present

## 2023-11-13 DIAGNOSIS — M159 Polyosteoarthritis, unspecified: Secondary | ICD-10-CM | POA: Diagnosis not present

## 2023-11-16 DIAGNOSIS — L578 Other skin changes due to chronic exposure to nonionizing radiation: Secondary | ICD-10-CM | POA: Diagnosis not present

## 2023-11-16 DIAGNOSIS — L818 Other specified disorders of pigmentation: Secondary | ICD-10-CM | POA: Diagnosis not present

## 2023-11-16 DIAGNOSIS — L648 Other androgenic alopecia: Secondary | ICD-10-CM | POA: Diagnosis not present

## 2023-11-16 DIAGNOSIS — R5383 Other fatigue: Secondary | ICD-10-CM | POA: Diagnosis not present

## 2023-12-21 DIAGNOSIS — M545 Low back pain, unspecified: Secondary | ICD-10-CM | POA: Diagnosis not present

## 2023-12-21 DIAGNOSIS — Z299 Encounter for prophylactic measures, unspecified: Secondary | ICD-10-CM | POA: Diagnosis not present

## 2023-12-21 DIAGNOSIS — F339 Major depressive disorder, recurrent, unspecified: Secondary | ICD-10-CM | POA: Diagnosis not present

## 2023-12-21 DIAGNOSIS — E1169 Type 2 diabetes mellitus with other specified complication: Secondary | ICD-10-CM | POA: Diagnosis not present

## 2023-12-21 DIAGNOSIS — I10 Essential (primary) hypertension: Secondary | ICD-10-CM | POA: Diagnosis not present

## 2023-12-29 DIAGNOSIS — Z299 Encounter for prophylactic measures, unspecified: Secondary | ICD-10-CM | POA: Diagnosis not present

## 2023-12-29 DIAGNOSIS — I10 Essential (primary) hypertension: Secondary | ICD-10-CM | POA: Diagnosis not present

## 2023-12-29 DIAGNOSIS — B356 Tinea cruris: Secondary | ICD-10-CM | POA: Diagnosis not present

## 2024-02-21 DIAGNOSIS — I10 Essential (primary) hypertension: Secondary | ICD-10-CM | POA: Diagnosis not present

## 2024-02-21 DIAGNOSIS — Z299 Encounter for prophylactic measures, unspecified: Secondary | ICD-10-CM | POA: Diagnosis not present

## 2024-02-21 DIAGNOSIS — E1165 Type 2 diabetes mellitus with hyperglycemia: Secondary | ICD-10-CM | POA: Diagnosis not present

## 2024-03-14 DIAGNOSIS — Z0100 Encounter for examination of eyes and vision without abnormal findings: Secondary | ICD-10-CM | POA: Diagnosis not present

## 2024-03-14 DIAGNOSIS — H524 Presbyopia: Secondary | ICD-10-CM | POA: Diagnosis not present

## 2024-03-18 DIAGNOSIS — E1165 Type 2 diabetes mellitus with hyperglycemia: Secondary | ICD-10-CM | POA: Diagnosis not present

## 2024-03-18 DIAGNOSIS — I10 Essential (primary) hypertension: Secondary | ICD-10-CM | POA: Diagnosis not present

## 2024-03-18 DIAGNOSIS — Z299 Encounter for prophylactic measures, unspecified: Secondary | ICD-10-CM | POA: Diagnosis not present

## 2024-04-04 DIAGNOSIS — F332 Major depressive disorder, recurrent severe without psychotic features: Secondary | ICD-10-CM | POA: Diagnosis not present

## 2024-04-04 DIAGNOSIS — I10 Essential (primary) hypertension: Secondary | ICD-10-CM | POA: Diagnosis not present

## 2024-04-04 DIAGNOSIS — M25561 Pain in right knee: Secondary | ICD-10-CM | POA: Diagnosis not present

## 2024-04-04 DIAGNOSIS — Z79899 Other long term (current) drug therapy: Secondary | ICD-10-CM | POA: Diagnosis not present

## 2024-04-04 DIAGNOSIS — Z1339 Encounter for screening examination for other mental health and behavioral disorders: Secondary | ICD-10-CM | POA: Diagnosis not present

## 2024-04-04 DIAGNOSIS — Z7189 Other specified counseling: Secondary | ICD-10-CM | POA: Diagnosis not present

## 2024-04-04 DIAGNOSIS — R5383 Other fatigue: Secondary | ICD-10-CM | POA: Diagnosis not present

## 2024-04-04 DIAGNOSIS — M179 Osteoarthritis of knee, unspecified: Secondary | ICD-10-CM | POA: Diagnosis not present

## 2024-04-04 DIAGNOSIS — M7989 Other specified soft tissue disorders: Secondary | ICD-10-CM | POA: Diagnosis not present

## 2024-04-04 DIAGNOSIS — Z1331 Encounter for screening for depression: Secondary | ICD-10-CM | POA: Diagnosis not present

## 2024-04-04 DIAGNOSIS — E7800 Pure hypercholesterolemia, unspecified: Secondary | ICD-10-CM | POA: Diagnosis not present

## 2024-04-04 DIAGNOSIS — Z299 Encounter for prophylactic measures, unspecified: Secondary | ICD-10-CM | POA: Diagnosis not present

## 2024-04-04 DIAGNOSIS — Z0000 Encounter for general adult medical examination without abnormal findings: Secondary | ICD-10-CM | POA: Diagnosis not present

## 2024-04-18 DIAGNOSIS — I10 Essential (primary) hypertension: Secondary | ICD-10-CM | POA: Diagnosis not present

## 2024-04-18 DIAGNOSIS — R110 Nausea: Secondary | ICD-10-CM | POA: Diagnosis not present

## 2024-04-18 DIAGNOSIS — Z6837 Body mass index (BMI) 37.0-37.9, adult: Secondary | ICD-10-CM | POA: Diagnosis not present

## 2024-04-18 DIAGNOSIS — E1169 Type 2 diabetes mellitus with other specified complication: Secondary | ICD-10-CM | POA: Diagnosis not present

## 2024-04-18 DIAGNOSIS — Z299 Encounter for prophylactic measures, unspecified: Secondary | ICD-10-CM | POA: Diagnosis not present

## 2024-04-25 ENCOUNTER — Other Ambulatory Visit: Payer: Self-pay | Admitting: Family Medicine

## 2024-04-25 DIAGNOSIS — Z1231 Encounter for screening mammogram for malignant neoplasm of breast: Secondary | ICD-10-CM

## 2024-05-13 DIAGNOSIS — Z7984 Long term (current) use of oral hypoglycemic drugs: Secondary | ICD-10-CM | POA: Diagnosis not present

## 2024-05-13 DIAGNOSIS — K91840 Postprocedural hemorrhage and hematoma of a digestive system organ or structure following a digestive system procedure: Secondary | ICD-10-CM | POA: Diagnosis not present

## 2024-05-13 DIAGNOSIS — E119 Type 2 diabetes mellitus without complications: Secondary | ICD-10-CM | POA: Diagnosis not present

## 2024-05-13 DIAGNOSIS — Z98818 Other dental procedure status: Secondary | ICD-10-CM | POA: Diagnosis not present

## 2024-05-13 DIAGNOSIS — I10 Essential (primary) hypertension: Secondary | ICD-10-CM | POA: Diagnosis not present

## 2024-05-13 DIAGNOSIS — Z886 Allergy status to analgesic agent status: Secondary | ICD-10-CM | POA: Diagnosis not present

## 2024-05-13 DIAGNOSIS — Z880 Allergy status to penicillin: Secondary | ICD-10-CM | POA: Diagnosis not present

## 2024-05-13 DIAGNOSIS — Z79899 Other long term (current) drug therapy: Secondary | ICD-10-CM | POA: Diagnosis not present

## 2024-05-13 DIAGNOSIS — L7622 Postprocedural hemorrhage and hematoma of skin and subcutaneous tissue following other procedure: Secondary | ICD-10-CM | POA: Diagnosis not present

## 2024-05-21 ENCOUNTER — Ambulatory Visit

## 2024-05-30 DIAGNOSIS — K1379 Other lesions of oral mucosa: Secondary | ICD-10-CM | POA: Diagnosis not present

## 2024-05-30 DIAGNOSIS — E119 Type 2 diabetes mellitus without complications: Secondary | ICD-10-CM | POA: Diagnosis not present

## 2024-05-30 DIAGNOSIS — I10 Essential (primary) hypertension: Secondary | ICD-10-CM | POA: Diagnosis not present

## 2024-05-30 DIAGNOSIS — Z6836 Body mass index (BMI) 36.0-36.9, adult: Secondary | ICD-10-CM | POA: Diagnosis not present

## 2024-05-30 DIAGNOSIS — Z299 Encounter for prophylactic measures, unspecified: Secondary | ICD-10-CM | POA: Diagnosis not present

## 2024-06-13 DIAGNOSIS — E1169 Type 2 diabetes mellitus with other specified complication: Secondary | ICD-10-CM | POA: Diagnosis not present

## 2024-06-13 DIAGNOSIS — I10 Essential (primary) hypertension: Secondary | ICD-10-CM | POA: Diagnosis not present

## 2024-06-13 DIAGNOSIS — R609 Edema, unspecified: Secondary | ICD-10-CM | POA: Diagnosis not present

## 2024-06-13 DIAGNOSIS — R52 Pain, unspecified: Secondary | ICD-10-CM | POA: Diagnosis not present

## 2024-06-13 DIAGNOSIS — Z299 Encounter for prophylactic measures, unspecified: Secondary | ICD-10-CM | POA: Diagnosis not present

## 2024-06-24 DIAGNOSIS — Z299 Encounter for prophylactic measures, unspecified: Secondary | ICD-10-CM | POA: Diagnosis not present

## 2024-06-24 DIAGNOSIS — R609 Edema, unspecified: Secondary | ICD-10-CM | POA: Diagnosis not present

## 2024-06-24 DIAGNOSIS — E1169 Type 2 diabetes mellitus with other specified complication: Secondary | ICD-10-CM | POA: Diagnosis not present

## 2024-06-24 DIAGNOSIS — M5416 Radiculopathy, lumbar region: Secondary | ICD-10-CM | POA: Diagnosis not present

## 2024-06-24 DIAGNOSIS — M545 Low back pain, unspecified: Secondary | ICD-10-CM | POA: Diagnosis not present

## 2024-06-24 DIAGNOSIS — R52 Pain, unspecified: Secondary | ICD-10-CM | POA: Diagnosis not present

## 2024-06-25 ENCOUNTER — Ambulatory Visit

## 2024-07-08 DIAGNOSIS — I10 Essential (primary) hypertension: Secondary | ICD-10-CM | POA: Diagnosis not present

## 2024-07-08 DIAGNOSIS — R609 Edema, unspecified: Secondary | ICD-10-CM | POA: Diagnosis not present

## 2024-07-08 DIAGNOSIS — W19XXXA Unspecified fall, initial encounter: Secondary | ICD-10-CM | POA: Diagnosis not present

## 2024-07-08 DIAGNOSIS — R52 Pain, unspecified: Secondary | ICD-10-CM | POA: Diagnosis not present

## 2024-07-08 DIAGNOSIS — Z299 Encounter for prophylactic measures, unspecified: Secondary | ICD-10-CM | POA: Diagnosis not present

## 2024-07-08 DIAGNOSIS — E1169 Type 2 diabetes mellitus with other specified complication: Secondary | ICD-10-CM | POA: Diagnosis not present

## 2024-07-11 DIAGNOSIS — H35463 Secondary vitreoretinal degeneration, bilateral: Secondary | ICD-10-CM | POA: Diagnosis not present

## 2024-08-20 DIAGNOSIS — Z5948 Other specified lack of adequate food: Secondary | ICD-10-CM | POA: Diagnosis not present

## 2024-08-20 DIAGNOSIS — E785 Hyperlipidemia, unspecified: Secondary | ICD-10-CM | POA: Diagnosis not present

## 2024-08-20 DIAGNOSIS — Z6836 Body mass index (BMI) 36.0-36.9, adult: Secondary | ICD-10-CM | POA: Diagnosis not present

## 2024-08-20 DIAGNOSIS — E1142 Type 2 diabetes mellitus with diabetic polyneuropathy: Secondary | ICD-10-CM | POA: Diagnosis not present

## 2024-08-20 DIAGNOSIS — M1990 Unspecified osteoarthritis, unspecified site: Secondary | ICD-10-CM | POA: Diagnosis not present

## 2024-08-20 DIAGNOSIS — M0550 Rheumatoid polyneuropathy with rheumatoid arthritis of unspecified site: Secondary | ICD-10-CM | POA: Diagnosis not present

## 2024-08-20 DIAGNOSIS — Z791 Long term (current) use of non-steroidal anti-inflammatories (NSAID): Secondary | ICD-10-CM | POA: Diagnosis not present

## 2024-08-20 DIAGNOSIS — K219 Gastro-esophageal reflux disease without esophagitis: Secondary | ICD-10-CM | POA: Diagnosis not present

## 2024-08-20 DIAGNOSIS — R011 Cardiac murmur, unspecified: Secondary | ICD-10-CM | POA: Diagnosis not present

## 2024-08-20 DIAGNOSIS — F329 Major depressive disorder, single episode, unspecified: Secondary | ICD-10-CM | POA: Diagnosis not present

## 2024-08-20 DIAGNOSIS — I129 Hypertensive chronic kidney disease with stage 1 through stage 4 chronic kidney disease, or unspecified chronic kidney disease: Secondary | ICD-10-CM | POA: Diagnosis not present

## 2024-08-20 DIAGNOSIS — Z833 Family history of diabetes mellitus: Secondary | ICD-10-CM | POA: Diagnosis not present

## 2024-08-20 DIAGNOSIS — Z5941 Food insecurity: Secondary | ICD-10-CM | POA: Diagnosis not present

## 2024-08-20 DIAGNOSIS — E1122 Type 2 diabetes mellitus with diabetic chronic kidney disease: Secondary | ICD-10-CM | POA: Diagnosis not present

## 2024-08-20 DIAGNOSIS — N1831 Chronic kidney disease, stage 3a: Secondary | ICD-10-CM | POA: Diagnosis not present

## 2024-08-20 DIAGNOSIS — Z8249 Family history of ischemic heart disease and other diseases of the circulatory system: Secondary | ICD-10-CM | POA: Diagnosis not present
